# Patient Record
Sex: Female | Born: 1985
Health system: Southern US, Community
[De-identification: ages and names within clinical notes are randomized; demographics above are authoritative.]

## PROBLEM LIST (undated history)

## (undated) DIAGNOSIS — N83209 Unspecified ovarian cyst, unspecified side: Secondary | ICD-10-CM

## (undated) DIAGNOSIS — K219 Gastro-esophageal reflux disease without esophagitis: Secondary | ICD-10-CM

## (undated) DIAGNOSIS — R7303 Prediabetes: Secondary | ICD-10-CM

## (undated) DIAGNOSIS — B379 Candidiasis, unspecified: Secondary | ICD-10-CM

## (undated) DIAGNOSIS — F419 Anxiety disorder, unspecified: Secondary | ICD-10-CM

## (undated) DIAGNOSIS — J301 Allergic rhinitis due to pollen: Secondary | ICD-10-CM

## (undated) DIAGNOSIS — T7840XA Allergy, unspecified, initial encounter: Secondary | ICD-10-CM

## (undated) HISTORY — DX: Gastro-esophageal reflux disease without esophagitis: K21.9

## (undated) HISTORY — DX: Candidiasis, unspecified: B37.9

## (undated) HISTORY — DX: Unspecified ovarian cyst, unspecified side: N83.209

## (undated) HISTORY — DX: Allergy, unspecified, initial encounter: T78.40XA

## (undated) HISTORY — DX: Anxiety disorder, unspecified: F41.9

## (undated) HISTORY — DX: Allergic rhinitis due to pollen: J30.1

## (undated) HISTORY — PX: WISDOM TOOTH EXTRACTION: SHX21

---

## 2008-12-22 ENCOUNTER — Emergency Department: Payer: Self-pay | Admitting: Unknown Physician Specialty

## 2009-04-14 ENCOUNTER — Emergency Department: Payer: Self-pay | Admitting: Emergency Medicine

## 2009-12-27 ENCOUNTER — Emergency Department (HOSPITAL_COMMUNITY): Admission: EM | Admit: 2009-12-27 | Discharge: 2009-12-28 | Payer: Self-pay | Admitting: Emergency Medicine

## 2010-01-21 ENCOUNTER — Emergency Department (HOSPITAL_COMMUNITY): Admission: EM | Admit: 2010-01-21 | Discharge: 2010-01-21 | Payer: Self-pay | Admitting: Family Medicine

## 2012-02-23 ENCOUNTER — Telehealth: Payer: Self-pay | Admitting: Obstetrics and Gynecology

## 2012-02-23 NOTE — Telephone Encounter (Signed)
Triage/pt last seen on 05/28/11/no future appts.

## 2012-02-24 ENCOUNTER — Other Ambulatory Visit: Payer: Self-pay | Admitting: Obstetrics and Gynecology

## 2012-02-24 ENCOUNTER — Telehealth: Payer: Self-pay | Admitting: Obstetrics and Gynecology

## 2012-02-24 MED ORDER — PRENATAL VIT-FEPOLY-FA-DHA 27-1-200 MG PO CAPS: 1.0000 | ORAL_CAPSULE | Freq: Every day | ORAL | Status: DC

## 2012-02-24 NOTE — Telephone Encounter (Signed)
TC to pt. LM to return call regarding message. 

## 2012-02-24 NOTE — Telephone Encounter (Signed)
VM received from pt.   LM to return call.

## 2012-02-24 NOTE — Telephone Encounter (Signed)
TC from pt.   Pharmacy does not have PNV Rx that was prescribed.  To obtain particular brand pharmacy carries to be ordered.  Pt may take OTC if she prefers or may obtain samples from office.

## 2012-02-24 NOTE — Telephone Encounter (Signed)
TC from pt.  States is trying to conceive.  Requests Rx for PNV w/DHA.  Will order per protocol.

## 2012-02-28 ENCOUNTER — Telehealth: Payer: Self-pay | Admitting: Obstetrics and Gynecology

## 2012-02-28 ENCOUNTER — Other Ambulatory Visit: Payer: Self-pay | Admitting: Obstetrics and Gynecology

## 2012-02-28 MED ORDER — CONCEPT DHA 53.5-38-1 MG PO CAPS: 1.0000 | ORAL_CAPSULE | Freq: Every day | ORAL | Status: DC

## 2012-02-28 NOTE — Telephone Encounter (Signed)
TC from pt.  Requesting Concept DHA.  Rx ordered per protocol.

## 2012-08-18 ENCOUNTER — Encounter: Payer: 59 | Admitting: Obstetrics and Gynecology

## 2012-09-01 ENCOUNTER — Ambulatory Visit: Payer: Self-pay | Admitting: Obstetrics and Gynecology

## 2012-09-28 ENCOUNTER — Ambulatory Visit: Payer: 59 | Admitting: Obstetrics and Gynecology

## 2012-10-23 ENCOUNTER — Ambulatory Visit: Payer: 59 | Admitting: Family Medicine

## 2012-10-23 ENCOUNTER — Encounter: Payer: Self-pay | Admitting: Family Medicine

## 2012-10-23 VITALS — BP 112/64 | HR 68 | Resp 16 | Wt 135.0 lb

## 2012-10-23 DIAGNOSIS — Z113 Encounter for screening for infections with a predominantly sexual mode of transmission: Secondary | ICD-10-CM

## 2012-10-23 DIAGNOSIS — Z3202 Encounter for pregnancy test, result negative: Secondary | ICD-10-CM

## 2012-10-23 DIAGNOSIS — Z0000 Encounter for general adult medical examination without abnormal findings: Secondary | ICD-10-CM

## 2012-10-23 DIAGNOSIS — N939 Abnormal uterine and vaginal bleeding, unspecified: Secondary | ICD-10-CM

## 2012-10-23 LAB — POCT WET PREP (WET MOUNT)
Clue Cells Wet Prep Whiff POC: NEGATIVE
pH: 4.5

## 2012-10-23 LAB — POCT URINALYSIS DIPSTICK
Bilirubin, UA: NEGATIVE
Glucose, UA: NEGATIVE
Ketones, UA: NEGATIVE
Leukocytes, UA: NEGATIVE
Nitrite, UA: NEGATIVE
Protein, UA: NEGATIVE
Spec Grav, UA: 1.025
Urobilinogen, UA: NEGATIVE
pH, UA: 5

## 2012-10-23 LAB — POCT URINE PREGNANCY: Preg Test, Ur: NEGATIVE

## 2012-10-23 NOTE — Progress Notes (Signed)
Last Pap: 05/28/2011 WNL: Yes Regular Periods:yes Contraception: None  Monthly Breast exam:yes Tetanus<39yrs:yes Nl.Bladder Function:yes Daily BMs:yes Healthy Diet:yes Calcium:no Mammogram:no Date of Mammogram: N/A Exercise:yes Have often Exercise: 3 x wkl Seatbelt: yes Abuse at home: no Stressful work:no Sigmoid-colonoscopy: None Bone Density: No PCP: Huston Foley Change in PMH: None Change in ZOX:WRUE   Subjective:    Kesleigh Morson is a 27 y.o. female, G1P0, who presents for an annual exam. The patient reports irregular menses x 1, spotting now and yesterday. Normally monthly menses without BTB.  No contraception.  Has been under a lot of stress lately and concerns of unfaithfulness from partner and partner without symptoms.    Menstrual cycle:   LMP: Patient's last menstrual period was 10/02/2012.             Review of Systems Pertinent items are noted in HPI. Denies pelvic pain, urinary tract symptoms, vaginitis symptoms, menopausal symptoms, change in bowel habits or rectal bleeding, no heat or cold intolerances, changes in weight, hair, skin, or breast discharge. Objective:    BP 112/64  Pulse 68  Resp 16  Wt 135 lb (61.236 kg)  LMP 10/02/2012   Wt Readings from Last 1 Encounters:  10/23/12 135 lb (61.236 kg)   There is no height on file to calculate BMI. General Appearance: Alert, no acute distress HEENT: Grossly normal Neck / Thyroid: Supple, no thyromegaly or cervical adenopathy Lungs: Clear to auscultation bilaterally Back: No CVA tenderness Breast Exam: No masses or nodes.No dimpling, nipple retraction or discharge. Cardiovascular: Regular rate and rhythm.  Gastrointestinal: Soft, non-tender, no masses or organomegaly Pelvic Exam: EGBUS-wnl, vagina-normal rugae, parous cervix- without lesions or tenderness, uterus appears normal size shape and consistency, adnexae-no masses or tenderness Lymphatic Exam: Non-palpable nodes in neck, clavicular,  axillary, or  inguinal regions  Skin: no rashes or abnormalities Extremities: no clubbing cyanosis or edema  Neurologic: grossly normal Psychiatric: Alert and oriented  Wet Prep: negative for BV, yeast, trich. Urinalysis: negative UPT: negative   Assessment:   Routine GYN Exam Irregular Menses   Plan:    GC/CT pending. RTO 1 year or prn  Elane Fritz, FNP-BC ]

## 2012-10-24 LAB — GC/CHLAMYDIA PROBE AMP
CT Probe RNA: NEGATIVE
GC Probe RNA: NEGATIVE

## 2012-11-22 ENCOUNTER — Encounter (HOSPITAL_COMMUNITY): Payer: Self-pay | Admitting: Emergency Medicine

## 2012-11-22 ENCOUNTER — Emergency Department (HOSPITAL_COMMUNITY)
Admission: EM | Admit: 2012-11-22 | Discharge: 2012-11-22 | Disposition: A | Discharge status: Home / Self Care | Payer: 59 | Source: Home / Self Care | Attending: Family Medicine | Admitting: Family Medicine

## 2012-11-22 DIAGNOSIS — M7520 Bicipital tendinitis, unspecified shoulder: Secondary | ICD-10-CM

## 2012-11-22 DIAGNOSIS — M7552 Bursitis of left shoulder: Secondary | ICD-10-CM

## 2012-11-22 DIAGNOSIS — M719 Bursopathy, unspecified: Secondary | ICD-10-CM

## 2012-11-22 DIAGNOSIS — M7522 Bicipital tendinitis, left shoulder: Secondary | ICD-10-CM

## 2012-11-22 MED ORDER — DICLOFENAC POTASSIUM 50 MG PO TABS: 50.0000 mg | ORAL_TABLET | Freq: Three times a day (TID) | ORAL | Status: DC

## 2012-11-22 NOTE — ED Notes (Signed)
Reports for the past 3 weeks in left shoulder has felt an irritation, inflammation, and now tight feeling in left upper arm. Patient remembers having a crick in left neck approx 3 weeks ago.  Patient recently started a work out routine 3 weeks ago

## 2012-11-22 NOTE — ED Provider Notes (Signed)
History     CSN: [id number]  Arrival date & time 11/22/12  4098   First MD Initiated Contact with Patient 11/22/12 1902      Chief Complaint  Patient presents with  . Arm Pain    (Consider location/radiation/quality/duration/timing/severity/associated sxs/prior treatment) Patient is a 27 y.o. female presenting with arm pain. The history is provided by the patient.  Arm Pain This is a new problem. The current episode started more than 1 week ago (3 wks ago assoc with exercise workout routine.). The problem has been gradually improving. Associated symptoms include chest pain. Pertinent negatives include no abdominal pain and no shortness of breath.    Past Medical History  Diagnosis Date  . Yeast infection     Past Surgical History  Procedure Laterality Date  . Wisdom tooth extraction      No family history on file.  History  Substance Use Topics  . Smoking status: Never Smoker   . Smokeless tobacco: Never Used  . Alcohol Use: No    OB History   Grav Para Term Preterm Abortions TAB SAB Ect Mult Living   1 0   1 1    0      Review of Systems  Constitutional: Negative.   HENT: Negative for neck pain and neck stiffness.   Respiratory: Negative for chest tightness, shortness of breath and wheezing.   Cardiovascular: Positive for chest pain. Negative for palpitations.  Gastrointestinal: Negative.  Negative for abdominal pain.  Genitourinary: Negative.     Allergies  Review of patient's allergies indicates no known allergies.  Home Medications   Current Outpatient Rx  Name  Route  Sig  Dispense  Refill  . diclofenac (CATAFLAM) 50 MG tablet   Oral   Take 1 tablet (50 mg total) by mouth 3 (three) times daily.   30 tablet   0   . Prenat-FeFum-FePo-FA-Omega 3 (CONCEPT DHA) 53.5-38-1 MG CAPS   Oral   Take 1 capsule by mouth daily.   30 capsule   12   . Prenatal Vit-FePoly-FA-DHA 27-1-200 MG CAPS   Oral   Take 1 tablet by mouth daily.   30 capsule   3       BP 129/84  Pulse 96  Temp(Src) 99.1 F (37.3 C) (Oral)  Resp 16  SpO2 100%  LMP 10/30/2012  Physical Exam  Nursing note and vitals reviewed. Constitutional: She is oriented to person, place, and time. She appears well-developed and well-nourished.  Neck: Normal range of motion. Neck supple.  Cardiovascular: Regular rhythm.   Pulmonary/Chest: Breath sounds normal.  Musculoskeletal: She exhibits tenderness.       Left shoulder: She exhibits tenderness, pain and spasm. She exhibits normal range of motion, no bony tenderness, no swelling, no effusion, normal pulse and normal strength.       Arms: Neurological: She is alert and oriented to person, place, and time.  Skin: Skin is warm and dry.    ED Course  Procedures (including critical care time)  Labs Reviewed - No data to display No results found.   1. Tendonitis, bicipital, left   2. Bursitis of shoulder region, left       MDM          Linna Hoff, MD 11/22/12 2016

## 2013-07-21 ENCOUNTER — Emergency Department (INDEPENDENT_AMBULATORY_CARE_PROVIDER_SITE_OTHER): Payer: 59

## 2013-07-21 ENCOUNTER — Emergency Department (INDEPENDENT_AMBULATORY_CARE_PROVIDER_SITE_OTHER)
Admission: EM | Admit: 2013-07-21 | Discharge: 2013-07-21 | Disposition: A | Discharge status: Home / Self Care | Payer: 59 | Source: Home / Self Care | Attending: Emergency Medicine | Admitting: Emergency Medicine

## 2013-07-21 ENCOUNTER — Encounter (HOSPITAL_COMMUNITY): Payer: Self-pay | Admitting: Emergency Medicine

## 2013-07-21 DIAGNOSIS — R05 Cough: Secondary | ICD-10-CM

## 2013-07-21 DIAGNOSIS — J069 Acute upper respiratory infection, unspecified: Secondary | ICD-10-CM

## 2013-07-21 DIAGNOSIS — R059 Cough, unspecified: Secondary | ICD-10-CM

## 2013-07-21 MED ORDER — FLUTICASONE PROPIONATE 50 MCG/ACT NA SUSP: 2.0000 | Freq: Two times a day (BID) | NASAL | Status: DC

## 2013-07-21 MED ORDER — METHYLPREDNISOLONE 4 MG PO KIT: PACK | ORAL | Status: DC

## 2013-07-21 MED ORDER — AZITHROMYCIN 250 MG PO TABS: ORAL_TABLET | ORAL | Status: DC

## 2013-07-21 MED ORDER — BENZONATATE 200 MG PO CAPS: 200.0000 mg | ORAL_CAPSULE | Freq: Three times a day (TID) | ORAL | Status: DC | PRN

## 2013-07-21 NOTE — ED Provider Notes (Signed)
Medical screening examination/treatment/procedure(s) were performed by non-physician practitioner and as supervising physician I was immediately available for consultation/collaboration.  Leslee Home, M.D.  Reuben Likes, MD 07/21/13 [phone]

## 2013-07-21 NOTE — ED Notes (Signed)
Pt c/o a productive cough w/green phlegm onset 11/28 Has been taking delsym w/no relief Denies: f/v/n/d, SOB, wheezing, cold sxs Alert w/no signs of acute distress.

## 2013-07-21 NOTE — ED Provider Notes (Signed)
CSN: [id number]     Arrival date & time 07/21/13  1520 History   First MD Initiated Contact with Patient 07/21/13 1727     Chief Complaint  Patient presents with  . Cough   (Consider location/radiation/quality/duration/timing/severity/associated sxs/prior Treatment) HPI Comments: 27 year old female presents complaining of cough for just over one week. The cough has been mild, rated 2/10. She is not experiencing any other symptoms. She is not particularly sick and she thinks the cough may be from postnasal drip. She denies fever, chills, pleuritic chest pain, shortness of breath, NVD, recent travel, sick contacts. She has taken very minimal amounts of over-the-counter medications which has helped her symptoms.  Patient is a 27 y.o. female presenting with cough.  Cough Associated symptoms: no chest pain, no chills, no fever, no myalgias, no rash, no rhinorrhea, no shortness of breath, no sore throat and no wheezing     Past Medical History  Diagnosis Date  . Yeast infection    Past Surgical History  Procedure Laterality Date  . Wisdom tooth extraction     No family history on file. History  Substance Use Topics  . Smoking status: Never Smoker   . Smokeless tobacco: Never Used  . Alcohol Use: No   OB History   Grav Para Term Preterm Abortions TAB SAB Ect Mult Living   1 0   1 1    0     Review of Systems  Constitutional: Negative for fever and chills.  HENT: Positive for congestion and postnasal drip. Negative for rhinorrhea, sinus pressure and sore throat.   Eyes: Negative for visual disturbance.  Respiratory: Positive for cough. Negative for chest tightness, shortness of breath and wheezing.   Cardiovascular: Negative for chest pain, palpitations and leg swelling.  Gastrointestinal: Negative for nausea, vomiting and abdominal pain.  Endocrine: Negative for polydipsia and polyuria.  Genitourinary: Negative for dysuria, urgency and frequency.  Musculoskeletal: Negative for  arthralgias and myalgias.  Skin: Negative for rash.  Neurological: Negative for dizziness, weakness and light-headedness.    Allergies  Review of patient's allergies indicates no known allergies.  Home Medications   Current Outpatient Rx  Name  Route  Sig  Dispense  Refill  . azithromycin (ZITHROMAX Z-PAK) 250 MG tablet      Use as directed   6 each   0   . benzonatate (TESSALON) 200 MG capsule   Oral   Take 1 capsule (200 mg total) by mouth 3 (three) times daily as needed for cough.   20 capsule   1   . diclofenac (CATAFLAM) 50 MG tablet   Oral   Take 1 tablet (50 mg total) by mouth 3 (three) times daily.   30 tablet   0   . fluticasone (FLONASE) 50 MCG/ACT nasal spray   Each Nare   Place 2 sprays into both nostrils 2 (two) times daily.   1 g   2   . methylPREDNISolone (MEDROL DOSEPAK) 4 MG tablet      follow package directions   21 tablet   0     Dispense as written.   . Prenat-FeFum-FePo-FA-Omega 3 (CONCEPT DHA) 53.5-38-1 MG CAPS   Oral   Take 1 capsule by mouth daily.   30 capsule   12   . Prenatal Vit-FePoly-FA-DHA 27-1-200 MG CAPS   Oral   Take 1 tablet by mouth daily.   30 capsule   3    BP 148/93  Pulse 97  Temp(Src) 99.4 F (37.4 C) (  Oral)  Resp 18  SpO2 99%  LMP 07/02/2013 Physical Exam  Nursing note and vitals reviewed. Constitutional: She is oriented to person, place, and time. Vital signs are normal. She appears well-developed and well-nourished. No distress.  HENT:  Head: Normocephalic and atraumatic.  Right Ear: External ear normal.  Left Ear: External ear normal.  Mouth/Throat: Oropharynx is clear and moist. No oropharyngeal exudate.  Eyes: Conjunctivae and EOM are normal. Pupils are equal, round, and reactive to light.  Neck: Normal range of motion. Neck supple. No JVD present.  Cardiovascular: Normal rate, regular rhythm and normal heart sounds.  Exam reveals no gallop and no friction rub.   No murmur  heard. Pulmonary/Chest: Effort normal and breath sounds normal. No respiratory distress. She has no wheezes. She has no rales.  Abdominal: Soft. There is no tenderness.  Lymphadenopathy:    She has no cervical adenopathy.  Neurological: She is alert and oriented to person, place, and time. She has normal strength. Coordination normal.  Skin: Skin is warm and dry. No rash noted. She is not diaphoretic.  Psychiatric: She has a normal mood and affect. Judgment normal.    ED Course  Procedures (including critical care time) Labs Review Labs Reviewed - No data to display Imaging Review Dg Chest 2 View  07/21/2013   CLINICAL DATA:  Persistent dry cough.  EXAM: CHEST  2 VIEW  COMPARISON:  None.  FINDINGS: The heart size and mediastinal contours are within normal limits. Both lungs are clear. The visualized skeletal structures are unremarkable.  IMPRESSION: No active cardiopulmonary disease.   Electronically Signed   By: Loralie Champagne M.D.   On: 07/21/2013 17:51      MDM   1. Cough   2. URI (upper respiratory infection)    Physical exam is normal, chest x-ray is normal, this is a viral URI. However, she does have a very low-grade fever. This may be normal, however I will write her a postdated prescription for azithromycin to take if she should start to get worse. She has been instructed to destroy this prescription if she does not use it for this illness within the next 2 weeks. Followup as needed.  Meds ordered this encounter  Medications  . azithromycin (ZITHROMAX Z-PAK) 250 MG tablet    Sig: Use as directed    Dispense:  6 each    Refill:  0    Order Specific Question:  Supervising Provider    Answer:  Lorenz Coaster, DAVID C [id number]  . methylPREDNISolone (MEDROL DOSEPAK) 4 MG tablet    Sig: follow package directions    Dispense:  21 tablet    Refill:  0    Order Specific Question:  Supervising Provider    Answer:  Lorenz Coaster, DAVID C [id number]  . fluticasone (FLONASE) 50 MCG/ACT nasal spray     Sig: Place 2 sprays into both nostrils 2 (two) times daily.    Dispense:  1 g    Refill:  2    Order Specific Question:  Supervising Provider    Answer:  Lorenz Coaster, DAVID C [id number]  . benzonatate (TESSALON) 200 MG capsule    Sig: Take 1 capsule (200 mg total) by mouth 3 (three) times daily as needed for cough.    Dispense:  20 capsule    Refill:  1    Order Specific Question:  Supervising Provider    Answer:  Lorenz Coaster, DAVID C [[id number]]     Graylon Good, PA-C 07/21/13 [phone]

## 2013-11-16 ENCOUNTER — Emergency Department (HOSPITAL_COMMUNITY)
Admission: EM | Admit: 2013-11-16 | Discharge: 2013-11-16 | Disposition: A | Discharge status: Home / Self Care | Payer: 59 | Source: Home / Self Care

## 2013-11-27 ENCOUNTER — Ambulatory Visit: Payer: Self-pay | Admitting: Family Medicine

## 2013-12-03 ENCOUNTER — Ambulatory Visit: Payer: Self-pay | Admitting: General Surgery

## 2013-12-26 ENCOUNTER — Ambulatory Visit: Payer: Self-pay | Admitting: General Surgery

## 2013-12-27 ENCOUNTER — Encounter: Payer: Self-pay | Admitting: *Deleted

## 2014-06-17 ENCOUNTER — Encounter (HOSPITAL_COMMUNITY): Payer: Self-pay | Admitting: Emergency Medicine

## 2014-09-19 ENCOUNTER — Encounter (HOSPITAL_COMMUNITY): Payer: Self-pay | Admitting: Emergency Medicine

## 2014-09-19 ENCOUNTER — Emergency Department (INDEPENDENT_AMBULATORY_CARE_PROVIDER_SITE_OTHER)
Admission: EM | Admit: 2014-09-19 | Discharge: 2014-09-19 | Disposition: A | Discharge status: Home / Self Care | Payer: 59 | Source: Home / Self Care | Attending: Family Medicine | Admitting: Family Medicine

## 2014-09-19 DIAGNOSIS — L03213 Periorbital cellulitis: Secondary | ICD-10-CM

## 2014-09-19 DIAGNOSIS — H00033 Abscess of eyelid right eye, unspecified eyelid: Secondary | ICD-10-CM

## 2014-09-19 MED ORDER — DOXYCYCLINE HYCLATE 100 MG PO CAPS: 100.0000 mg | ORAL_CAPSULE | Freq: Two times a day (BID) | ORAL | Status: DC

## 2014-09-19 NOTE — ED Notes (Signed)
C/o  Bump under right eye since Sunday.  States "woke this a.m with a puffy eye".   No otc treats tried.

## 2014-09-19 NOTE — Discharge Instructions (Signed)
Thank you for coming in today. Take doxycycline twice daily for one week. Use warm compress. Use birth control methods will taking this antibiotics.  Cellulitis Cellulitis is an infection of the skin and the tissue beneath it. The infected area is usually red and tender. Cellulitis occurs most often in the arms and lower legs.  CAUSES  Cellulitis is caused by bacteria that enter the skin through cracks or cuts in the skin. The most common types of bacteria that cause cellulitis are staphylococci and streptococci. SIGNS AND SYMPTOMS   Redness and warmth.  Swelling.  Tenderness or pain.  Fever. DIAGNOSIS  Your health care provider can usually determine what is wrong based on a physical exam. Blood tests may also be done. TREATMENT  Treatment usually involves taking an antibiotic medicine. HOME CARE INSTRUCTIONS   Take your antibiotic medicine as directed by your health care provider. Finish the antibiotic even if you start to feel better.  Keep the infected arm or leg elevated to reduce swelling.  Apply a warm cloth to the affected area up to 4 times per day to relieve pain.  Take medicines only as directed by your health care provider.  Keep all follow-up visits as directed by your health care provider. SEEK MEDICAL CARE IF:   You notice red streaks coming from the infected area.  Your red area gets larger or turns dark in color.  Your bone or joint underneath the infected area becomes painful after the skin has healed.  Your infection returns in the same area or another area.  You notice a swollen bump in the infected area.  You develop new symptoms.  You have a fever. SEEK IMMEDIATE MEDICAL CARE IF:   You feel very sleepy.  You develop vomiting or diarrhea.  You have a general ill feeling (malaise) with muscle aches and pains. MAKE SURE YOU:   Understand these instructions.  Will watch your condition.  Will get help right away if you are not doing well or  get worse. Document Released: 05/12/2005 Document Revised: 12/17/2013 Document Reviewed: 10/18/2011 [facility] Patient Information 2015 [place], [place]. This information is not intended to replace advice given to you by your health care provider. Make sure you discuss any questions you have with your health care provider.

## 2014-09-19 NOTE — ED Notes (Signed)
Bed: UC09 Expected date:  Expected time:  Means of arrival:  Comments: 

## 2014-09-19 NOTE — ED Provider Notes (Signed)
Jessica Villarreal is a 29 y.o. female who presents to Urgent Care today for right lower eyelid swelling present for 4 days worsening today. No significant pain no blurry vision or eye discharge. Patient has tried some topical clindamycin cream which has not helped. She has a history of acne. No fevers.   Past Medical History  Diagnosis Date  . Yeast infection    Past Surgical History  Procedure Laterality Date  . Wisdom tooth extraction     History  Substance Use Topics  . Smoking status: Never Smoker   . Smokeless tobacco: Never Used  . Alcohol Use: No   ROS as above Medications: No current facility-administered medications for this encounter.   Current Outpatient Prescriptions  Medication Sig Dispense Refill  . azithromycin (ZITHROMAX Z-PAK) 250 MG tablet Use as directed 6 each 0  . benzonatate (TESSALON) 200 MG capsule Take 1 capsule (200 mg total) by mouth 3 (three) times daily as needed for cough. 20 capsule 1  . diclofenac (CATAFLAM) 50 MG tablet Take 1 tablet (50 mg total) by mouth 3 (three) times daily. 30 tablet 0  . doxycycline (VIBRAMYCIN) 100 MG capsule Take 1 capsule (100 mg total) by mouth 2 (two) times daily. 14 capsule 0  . fluticasone (FLONASE) 50 MCG/ACT nasal spray Place 2 sprays into both nostrils 2 (two) times daily. 1 g 2  . methylPREDNISolone (MEDROL DOSEPAK) 4 MG tablet follow package directions 21 tablet 0  . Prenat-FeFum-FePo-FA-Omega 3 (CONCEPT DHA) 53.5-38-1 MG CAPS Take 1 capsule by mouth daily. 30 capsule 12  . Prenatal Vit-FePoly-FA-DHA 27-1-200 MG CAPS Take 1 tablet by mouth daily. 30 capsule 3   No Known Allergies   Exam:  BP 128/81 mmHg  Pulse 87  Temp(Src) 98 F (36.7 C) (Oral)  Resp 16  SpO2 100%  LMP 09/14/2014 Gen: Well NAD HEENT: EOMI,  MMM mild right lower eyelid swelling. No conjunctival injection or discharge. Normal eye motion without pain. Lungs: Normal work of breathing. CTABL Heart: RRR no MRG Exts: Brisk capillary refill,  warm and well perfused.  Skin: Small tender erythematous papule just inferior to the right lower eyelid.  No results found for this or any previous visit (from the past 24 hour(s)). No results found.  Assessment and Plan: 29 y.o. female with probable cystic acne with emerging preseptal cellulitis. Treat with doxycycline watchful waiting and warm compresses. Return as needed.  Discussed warning signs or symptoms. Please see discharge instructions. Patient expresses understanding.     Gregor Hams, MD 09/19/14 [phone]

## 2015-02-12 ENCOUNTER — Encounter: Payer: Self-pay | Admitting: Family Medicine

## 2015-10-10 ENCOUNTER — Encounter: Payer: Self-pay | Admitting: Physician Assistant

## 2015-10-10 ENCOUNTER — Ambulatory Visit (INDEPENDENT_AMBULATORY_CARE_PROVIDER_SITE_OTHER): Payer: 59 | Admitting: Physician Assistant

## 2015-10-10 VITALS — BP 102/70 | HR 82 | Temp 98.8°F | Resp 16 | Wt 137.0 lb

## 2015-10-10 DIAGNOSIS — R059 Cough, unspecified: Secondary | ICD-10-CM

## 2015-10-10 DIAGNOSIS — J302 Other seasonal allergic rhinitis: Secondary | ICD-10-CM | POA: Diagnosis not present

## 2015-10-10 DIAGNOSIS — R05 Cough: Secondary | ICD-10-CM | POA: Diagnosis not present

## 2015-10-10 DIAGNOSIS — J309 Allergic rhinitis, unspecified: Secondary | ICD-10-CM | POA: Insufficient documentation

## 2015-10-10 DIAGNOSIS — Z111 Encounter for screening for respiratory tuberculosis: Secondary | ICD-10-CM | POA: Insufficient documentation

## 2015-10-10 MED ORDER — FLUTICASONE PROPIONATE 50 MCG/ACT NA SUSP: 2.0000 | Freq: Every day | NASAL | Status: DC

## 2015-10-10 MED ORDER — BENZONATATE 200 MG PO CAPS: 200.0000 mg | ORAL_CAPSULE | Freq: Three times a day (TID) | ORAL | Status: DC | PRN

## 2015-10-10 MED ORDER — HYDROCODONE-HOMATROPINE 5-1.5 MG/5ML PO SYRP: 5.0000 mL | ORAL_SOLUTION | Freq: Three times a day (TID) | ORAL | Status: DC | PRN

## 2015-10-10 NOTE — Patient Instructions (Signed)
Allergies An allergy is an abnormal reaction to a substance by the body's defense system (immune system). Allergies can develop at any age. WHAT CAUSES ALLERGIES? An allergic reaction happens when the immune system mistakenly reacts to a normally harmless substance, called an allergen, as if it were harmful. The immune system releases antibodies to fight the substance. Antibodies eventually release a chemical called histamine into the bloodstream. The release of histamine is meant to protect the body from infection, but it also causes discomfort. An allergic reaction can be triggered by:  Eating an allergen.  Inhaling an allergen.  Touching an allergen. WHAT TYPES OF ALLERGIES ARE THERE? There are many types of allergies. Common types include:  Seasonal allergies. People with this type of allergy are usually allergic to substances that are only present during certain seasons, such as molds and pollens.  Food allergies.  Drug allergies.  Insect allergies.  Animal dander allergies. WHAT ARE SYMPTOMS OF ALLERGIES? Possible allergy symptoms include:  Swelling of the lips, face, tongue, mouth, or throat.  Sneezing, coughing, or wheezing.  Nasal congestion.  Tingling in the mouth.  Rash.  Itching.  Itchy, red, swollen areas of skin (hives).  Watery eyes.  Vomiting.  Diarrhea.  Dizziness.  Lightheadedness.  Fainting.  Trouble breathing or swallowing.  Chest tightness.  Rapid heartbeat. HOW ARE ALLERGIES DIAGNOSED? Allergies are diagnosed with a medical and family history and one or more of the following:  Skin tests.  Blood tests.  A food diary. A food diary is a record of all the foods and drinks you have in a day and of all the symptoms you experience.  The results of an elimination diet. An elimination diet involves eliminating foods from your diet and then adding them back in one by one to find out if a certain food causes an allergic reaction. HOW ARE  ALLERGIES TREATED? There is no cure for allergies, but allergic reactions can be treated with medicine. Severe reactions usually need to be treated at a hospital. HOW CAN REACTIONS BE PREVENTED? The best way to prevent an allergic reaction is by avoiding the substance you are allergic to. Allergy shots and medicines can also help prevent reactions in some cases. People with severe allergic reactions may be able to prevent a life-threatening reaction called anaphylaxis with a medicine given right after exposure to the allergen.   This information is not intended to replace advice given to you by your health care provider. Make sure you discuss any questions you have with your health care provider.   Document Released: 10/26/2002 Document Revised: 08/23/2014 Document Reviewed: 05/14/2014 Elsevier Interactive Patient Education 2016 [employer] Cough, Adult Coughing is a reflex that clears your throat and your airways. Coughing helps to heal and protect your lungs. It is normal to cough occasionally, but a cough that happens with other symptoms or lasts a long time may be a sign of a condition that needs treatment. A cough may last only 2-3 weeks (acute), or it may last longer than 8 weeks (chronic). CAUSES Coughing is commonly caused by:  Breathing in substances that irritate your lungs.  A viral or bacterial respiratory infection.  Allergies.  Asthma.  Postnasal drip.  Smoking.  Acid backing up from the stomach into the esophagus (gastroesophageal reflux).  Certain medicines.  Chronic lung problems, including COPD (or rarely, lung cancer).  Other medical conditions such as heart failure. HOME CARE INSTRUCTIONS  Pay attention to any changes in your symptoms. Take these actions to help  with your discomfort:  Take medicines only as told by your health care provider.  If you were prescribed an antibiotic medicine, take it as told by your health care provider. Do not stop taking the  antibiotic even if you start to feel better.  Talk with your health care provider before you take a cough suppressant medicine.  Drink enough fluid to keep your urine clear or pale yellow.  If the air is dry, use a cold steam vaporizer or humidifier in your bedroom or your home to help loosen secretions.  Avoid anything that causes you to cough at work or at home.  If your cough is worse at night, try sleeping in a semi-upright position.  Avoid cigarette smoke. If you smoke, quit smoking. If you need help quitting, ask your health care provider.  Avoid caffeine.  Avoid alcohol.  Rest as needed. SEEK MEDICAL CARE IF:   You have new symptoms.  You cough up pus.  Your cough does not get better after 2-3 weeks, or your cough gets worse.  You cannot control your cough with suppressant medicines and you are losing sleep.  You develop pain that is getting worse or pain that is not controlled with pain medicines.  You have a fever.  You have unexplained weight loss.  You have night sweats. SEEK IMMEDIATE MEDICAL CARE IF:  You cough up blood.  You have difficulty breathing.  Your heartbeat is very fast.   This information is not intended to replace advice given to you by your health care provider. Make sure you discuss any questions you have with your health care provider.   Document Released: 01/29/2011 Document Revised: 04/23/2015 Document Reviewed: 10/09/2014 Elsevier Interactive Patient Education [employer].

## 2015-10-10 NOTE — Progress Notes (Signed)
Patient: Jessica Villarreal Female    DOB: 1986/02/27   30 y.o.   MRN: [id number] Visit Date: 10/10/2015  Today's Provider: Mar Daring, PA-C   Chief Complaint  Patient presents with  . Cough   Subjective:    Cough This is a new problem. The current episode started in the past 7 days (cough got worst on Tuesday). The problem has been gradually worsening. The problem occurs constantly. The cough is productive of sputum (Clear). Associated symptoms include postnasal drip and a sore throat (scratchy on the left side.). Pertinent negatives include no chest pain, chills, ear pain, fever, headaches, nasal congestion, rhinorrhea, shortness of breath or wheezing. The symptoms are aggravated by lying down (talking). Treatments tried: Delsym, Zyrtec. The treatment provided mild relief.      No Known Allergies Previous Medications   AZITHROMYCIN (ZITHROMAX Z-PAK) 250 MG TABLET    Use as directed   BENZONATATE (TESSALON) 200 MG CAPSULE    Take 1 capsule (200 mg total) by mouth 3 (three) times daily as needed for cough.   DOXYCYCLINE (VIBRAMYCIN) 100 MG CAPSULE    Take 1 capsule (100 mg total) by mouth 2 (two) times daily.   FLUTICASONE (FLONASE) 50 MCG/ACT NASAL SPRAY    Place 2 sprays into both nostrils 2 (two) times daily.   METHYLPREDNISOLONE (MEDROL DOSEPAK) 4 MG TABLET    follow package directions   TRI-PREVIFEM 0.18/0.215/0.25 MG-35 MCG TABLET    Take 1 tablet by mouth daily. as directed    Review of Systems  Constitutional: Negative for fever and chills.  HENT: Positive for congestion, postnasal drip, sinus pressure and sore throat (scratchy on the left side.). Negative for ear pain, rhinorrhea and sneezing.   Respiratory: Positive for cough. Negative for chest tightness, shortness of breath and wheezing.   Cardiovascular: Positive for palpitations. Negative for chest pain and leg swelling.  Gastrointestinal: Negative for nausea, vomiting and abdominal pain.  Neurological:  Negative for dizziness, weakness, light-headedness and headaches.    Social History  Substance Use Topics  . Smoking status: Never Smoker   . Smokeless tobacco: Never Used  . Alcohol Use: No   Objective:   BP 102/70 mmHg  Pulse 82  Temp(Src) 98.8 F (37.1 C) (Oral)  Resp 16  Wt 137 lb (62.143 kg)  SpO2 97%  LMP 09/17/2015  Physical Exam  Constitutional: She appears well-developed and well-nourished. No distress.  HENT:  Head: Normocephalic and atraumatic.  Right Ear: Hearing, tympanic membrane, external ear and ear canal normal.  Left Ear: Hearing, tympanic membrane, external ear and ear canal normal.  Nose: Mucosal edema (enlarged, pale turbinates) and rhinorrhea present. Right sinus exhibits no maxillary sinus tenderness and no frontal sinus tenderness. Left sinus exhibits no maxillary sinus tenderness and no frontal sinus tenderness.  Mouth/Throat: Uvula is midline, oropharynx is clear and moist and mucous membranes are normal. No oropharyngeal exudate, posterior oropharyngeal edema or posterior oropharyngeal erythema.  Eyes: Conjunctivae are normal. Pupils are equal, round, and reactive to light. Right eye exhibits no discharge. Left eye exhibits no discharge. No scleral icterus.  Neck: Normal range of motion. Neck supple. No tracheal deviation present. No thyromegaly present.  Cardiovascular: Normal rate, regular rhythm and normal heart sounds.  Exam reveals no gallop and no friction rub.   No murmur heard. Pulmonary/Chest: Effort normal and breath sounds normal. No stridor. No respiratory distress. She has no wheezes. She has no rales.  Lymphadenopathy:    She has  no cervical adenopathy.  Skin: Skin is warm and dry. She is not diaphoretic.  Vitals reviewed.       Assessment & Plan:     1. Cough Worsening cough most likely secondary to post nasal drainage from allergies.  Will give tessalon perles as below for daytime cough. Hycodan cough syrup for nighttime cough.  Advised to stay well hydrated and get plenty of rest. She is to call the office if cough worsens. - benzonatate (TESSALON) 200 MG capsule; Take 1 capsule (200 mg total) by mouth 3 (three) times daily as needed for cough.  Dispense: 30 capsule; Refill: 1 - HYDROcodone-homatropine (HYCODAN) 5-1.5 MG/5ML syrup; Take 5 mLs by mouth every 8 (eight) hours as needed for cough.  Dispense: 180 mL; Refill: 0  2. Other seasonal allergic rhinitis I do feel symptoms are secondary to uncontrolled allergies.  She has had flonase before for allergic rhinitis and used successfully.  Will refill as below. Advised to also continue zyrtec daily through allergy season to help prevent infections. - fluticasone (FLONASE) 50 MCG/ACT nasal spray; Place 2 sprays into both nostrils daily.  Dispense: 16 g; Refill: 2       Mar Daring, PA-C  [facility]

## 2015-10-13 ENCOUNTER — Encounter: Payer: Self-pay | Admitting: Physician Assistant

## 2015-10-13 ENCOUNTER — Ambulatory Visit (INDEPENDENT_AMBULATORY_CARE_PROVIDER_SITE_OTHER): Payer: 59 | Admitting: Physician Assistant

## 2015-10-13 VITALS — BP 104/70 | HR 98 | Temp 98.5°F | Resp 16 | Wt 138.6 lb

## 2015-10-13 DIAGNOSIS — H6983 Other specified disorders of Eustachian tube, bilateral: Secondary | ICD-10-CM | POA: Diagnosis not present

## 2015-10-13 NOTE — Patient Instructions (Signed)

## 2015-10-13 NOTE — Progress Notes (Signed)
Patient: Jessica Villarreal Female    DOB: 12-28-1985   30 y.o.   MRN: [id number] Visit Date: 10/13/2015  Today's Provider: Mar Daring, PA-C   Chief Complaint  Patient presents with  . Cerumen Impaction   Subjective:    HPI  Jessica Villarreal is a 30 yr old female that is here today with complaints of a fullness behind both of her ears.  She denies any pain, dizziness, tinnitus, hearing loss or headaches.  Wants to make sure not cerumen impaction.     No Known Allergies Previous Medications   BENZONATATE (TESSALON) 200 MG CAPSULE    Take 1 capsule (200 mg total) by mouth 3 (three) times daily as needed for cough.   FLUTICASONE (FLONASE) 50 MCG/ACT NASAL SPRAY    Place 2 sprays into both nostrils daily.   HYDROCODONE-HOMATROPINE (HYCODAN) 5-1.5 MG/5ML SYRUP    Take 5 mLs by mouth every 8 (eight) hours as needed for cough.   TRI-PREVIFEM 0.18/0.215/0.25 MG-35 MCG TABLET    Take 1 tablet by mouth daily. as directed    Review of Systems  Constitutional: Negative.   HENT: Positive for postnasal drip and sinus pressure. Negative for ear discharge, ear pain, sore throat and tinnitus.   Respiratory: Positive for cough. Negative for chest tightness, shortness of breath and wheezing.   Cardiovascular: Negative for chest pain.  Gastrointestinal: Negative for nausea, vomiting and abdominal pain.  Neurological: Negative for dizziness and headaches.    Social History  Substance Use Topics  . Smoking status: Never Smoker   . Smokeless tobacco: Never Used  . Alcohol Use: No   Objective:   BP 104/70 mmHg  Pulse 98  Temp(Src) 98.5 F (36.9 C) (Oral)  Resp 16  Wt 138 lb 9.6 oz (62.869 kg)  LMP 09/17/2015  Physical Exam  Constitutional: She appears well-developed and well-nourished. No distress.  HENT:  Head: Normocephalic and atraumatic.  Right Ear: Hearing, external ear and ear canal normal. Tympanic membrane is bulging (slight). Tympanic membrane is not perforated and  not erythematous. A middle ear effusion (cleAR) is present.  Left Ear: Hearing, external ear and ear canal normal. Tympanic membrane is bulging (slight). Tympanic membrane is not perforated and not erythematous. A middle ear effusion (clear) is present.  Nose: Nose normal. Right sinus exhibits no maxillary sinus tenderness and no frontal sinus tenderness. Left sinus exhibits no maxillary sinus tenderness and no frontal sinus tenderness.  Mouth/Throat: Uvula is midline, oropharynx is clear and moist and mucous membranes are normal. No oropharyngeal exudate, posterior oropharyngeal edema or posterior oropharyngeal erythema.  Eyes: Conjunctivae are normal. Pupils are equal, round, and reactive to light. Right eye exhibits no discharge. Left eye exhibits no discharge. No scleral icterus.  Neck: Normal range of motion. Neck supple. No tracheal deviation present. No thyromegaly present.  Cardiovascular: Normal rate, regular rhythm and normal heart sounds.  Exam reveals no gallop and no friction rub.   No murmur heard. Pulmonary/Chest: Effort normal and breath sounds normal. No stridor. No respiratory distress. She has no wheezes. She has no rales.  Lymphadenopathy:    She has no cervical adenopathy.  Skin: Skin is warm and dry. She is not diaphoretic.  Vitals reviewed.       Assessment & Plan:     1. ETD (eustachian tube dysfunction), bilateral Advised to add sudafed to current allergy regimen of flonase and claritin which she just started on Friday 10/10/15.  She is to do this  for 2 weeks and stop sudafed. She is to call the office at anytime if she develops pain, fevers, discharge from the ear, tinnitus or dizziness.       Mar Daring, PA-C  [facility]

## 2015-11-26 ENCOUNTER — Ambulatory Visit: Payer: 59 | Admitting: Gastroenterology

## 2016-06-03 ENCOUNTER — Encounter (HOSPITAL_COMMUNITY): Payer: Self-pay | Admitting: Emergency Medicine

## 2016-06-03 ENCOUNTER — Ambulatory Visit (HOSPITAL_COMMUNITY)
Admission: EM | Admit: 2016-06-03 | Discharge: 2016-06-03 | Disposition: A | Discharge status: Home / Self Care | Payer: 59 | Attending: Family Medicine | Admitting: Family Medicine

## 2016-06-03 DIAGNOSIS — R002 Palpitations: Secondary | ICD-10-CM | POA: Diagnosis not present

## 2016-06-03 MED ORDER — CLONAZEPAM 0.5 MG PO TABS: 0.5000 mg | ORAL_TABLET | Freq: Two times a day (BID) | ORAL | 0 refills | Status: DC | PRN

## 2016-06-03 NOTE — ED Provider Notes (Signed)
[facility]    CSN: [id number] Arrival date & time: 06/03/16  1059     History   Chief Complaint No chief complaint on file.   HPI Jessica Villarreal is a 30 y.o. female.   The history is provided by the patient.  Palpitations  Palpitations quality:  Fast Onset quality:  Sudden Duration:  2 days Progression:  Unchanged Chronicity:  Recurrent Context: appetite suppressants   Relieved by:  None tried Worsened by:  Stress Ineffective treatments:  None tried Associated symptoms: no back pain, no chest pain, no chest pressure, no dizziness, no leg pain and no shortness of breath     Past Medical History:  Diagnosis Date  . Yeast infection     Patient Active Problem List   Diagnosis Date Noted  . Allergic rhinitis 10/10/2015  . Encounter for screening for respiratory tuberculosis 10/10/2015    Past Surgical History:  Procedure Laterality Date  . WISDOM TOOTH EXTRACTION      OB History    Gravida Para Term Preterm AB Living   1 0     1 0   SAB TAB Ectopic Multiple Live Births     1             Home Medications    Prior to Admission medications   Medication Sig Start Date End Date Taking? Authorizing Provider  benzonatate (TESSALON) 200 MG capsule Take 1 capsule (200 mg total) by mouth 3 (three) times daily as needed for cough. 10/10/15   Mar Daring, PA-C  fluticasone (FLONASE) 50 MCG/ACT nasal spray Place 2 sprays into both nostrils daily. 10/10/15   Mar Daring, PA-C  HYDROcodone-homatropine (HYCODAN) 5-1.5 MG/5ML syrup Take 5 mLs by mouth every 8 (eight) hours as needed for cough. 10/10/15   Mar Daring, PA-C  TRI-PREVIFEM 0.18/0.215/0.25 MG-35 MCG tablet Take 1 tablet by mouth daily. as directed 08/28/15   Historical Provider, MD    Family History No family history on file.  Social History Social History  Substance Use Topics  . Smoking status: Never Smoker  . Smokeless tobacco: Never Used  . Alcohol use No      Allergies   Review of patient's allergies indicates no known allergies.   Review of Systems Review of Systems  HENT: Negative.   Respiratory: Negative.  Negative for shortness of breath.   Cardiovascular: Positive for palpitations. Negative for chest pain.  Gastrointestinal: Negative.   Musculoskeletal: Negative for back pain.  Neurological: Negative for dizziness.  Psychiatric/Behavioral: Positive for sleep disturbance.  All other systems reviewed and are negative.    Physical Exam Triage Vital Signs ED Triage Vitals [06/03/16 1223]  Enc Vitals Group     BP 119/76     Pulse Rate 86     Resp 12     Temp 99.7 F (37.6 C)     Temp Source Oral     SpO2 100 %     Weight      Height      Head Circumference      Peak Flow      Pain Score      Pain Loc      Pain Edu?      Excl. in [place]?    No data found.   Updated Vital Signs BP 119/76 (BP Location: Left Arm)   Pulse 86   Temp 99.7 F (37.6 C) (Oral)   Resp 12   LMP 05/14/2016   SpO2 100%  Visual Acuity Right Eye Distance:   Left Eye Distance:   Bilateral Distance:    Right Eye Near:   Left Eye Near:    Bilateral Near:     Physical Exam  Constitutional: She appears well-developed and well-nourished. No distress.  Neck: Normal range of motion. Neck supple. No thyromegaly present.  Cardiovascular: Normal rate, regular rhythm, normal heart sounds and intact distal pulses.   Pulmonary/Chest: Effort normal and breath sounds normal.  Lymphadenopathy:    She has no cervical adenopathy.  Skin: Skin is warm and dry.  Nursing note and vitals reviewed.    UC Treatments / Results  Labs (all labs ordered are listed, but only abnormal results are displayed) Labs Reviewed - No data to display  EKG ED ECG REPORT   Date: 06/03/2016  Rate: 90  Rhythm: normal sinus rhythm  QRS Axis: normal  Intervals: normal  ST/T Wave abnormalities: normal  Conduction Disutrbances:none  Narrative Interpretation:    Old EKG Reviewed: unchanged  I have personally reviewed the EKG tracing and agree with the computerized printout as noted.   Radiology No results found.  Procedures Procedures (including critical care time)  Medications Ordered in UC Medications - No data to display   Initial Impression / Assessment and Plan / UC Course  I have reviewed the triage vital signs and the nursing notes.  Pertinent labs & imaging results that were available during my care of the patient were reviewed by me and considered in my medical decision making (see chart for details).  Clinical Course    Admits to work stress and will pursue work eap program.  Final Clinical Impressions(s) / UC Diagnoses   Final diagnoses:  None    New Prescriptions New Prescriptions   No medications on file     Billy Fischer, MD 06/03/16 1308

## 2016-06-03 NOTE — ED Triage Notes (Signed)
Reports similar episode in 2010-told she was dehydrated by the ed.    Reports palpitations since 10/17.  Feeling "excited, but not excited about anything" reports only getting 3-4 hours sleep.  Patient is tearful

## 2016-06-03 NOTE — ED Notes (Signed)
Patient is very tearful and concerned for work environment.

## 2016-06-03 NOTE — Discharge Instructions (Signed)
See your doctor if further issues.

## 2016-08-11 ENCOUNTER — Encounter (HOSPITAL_COMMUNITY): Payer: Self-pay | Admitting: Emergency Medicine

## 2016-08-11 ENCOUNTER — Ambulatory Visit (HOSPITAL_COMMUNITY)
Admission: EM | Admit: 2016-08-11 | Discharge: 2016-08-11 | Disposition: A | Discharge status: Home / Self Care | Payer: 59 | Attending: Family Medicine | Admitting: Family Medicine

## 2016-08-11 DIAGNOSIS — N760 Acute vaginitis: Secondary | ICD-10-CM | POA: Insufficient documentation

## 2016-08-11 DIAGNOSIS — Z79899 Other long term (current) drug therapy: Secondary | ICD-10-CM | POA: Insufficient documentation

## 2016-08-11 DIAGNOSIS — B9689 Other specified bacterial agents as the cause of diseases classified elsewhere: Secondary | ICD-10-CM

## 2016-08-11 MED ORDER — METRONIDAZOLE 500 MG PO TABS: 500.0000 mg | ORAL_TABLET | Freq: Two times a day (BID) | ORAL | 0 refills | Status: DC

## 2016-08-11 NOTE — ED Provider Notes (Signed)
CSN: [id number]     Arrival date & time 08/11/16  1916 History   First MD Initiated Contact with Patient 08/11/16 1952     Chief Complaint  Patient presents with  . Vaginal Discharge   (Consider location/radiation/quality/duration/timing/severity/associated sxs/prior Treatment) 30 year old female presents to clinic with chief complaint of vaginal discharge. Patient describes discharge as being smooth in texture and yellow, patient denies vaginal pain, burning, itching, abdominal pain, or pain with intercourse. Patient reports she is monogamous with one partner and uses condoms with each encounter.     The history is provided by the patient.  Vaginal Discharge  Associated symptoms: no dyspareunia and no dysuria     Past Medical History:  Diagnosis Date  . Yeast infection    Past Surgical History:  Procedure Laterality Date  . WISDOM TOOTH EXTRACTION     History reviewed. No pertinent family history. Social History  Substance Use Topics  . Smoking status: Never Smoker  . Smokeless tobacco: Never Used  . Alcohol use No   OB History    Gravida Para Term Preterm AB Living   1 0     1 0   SAB TAB Ectopic Multiple Live Births     1           Review of Systems  Constitutional: Negative.   HENT: Negative.   Respiratory: Negative.   Cardiovascular: Negative.   Gastrointestinal: Negative.   Genitourinary: Positive for vaginal discharge. Negative for difficulty urinating, dyspareunia, dysuria, flank pain, frequency, genital sores, hematuria, menstrual problem, pelvic pain, urgency, vaginal bleeding and vaginal pain.  Musculoskeletal: Negative.   Neurological: Negative.   Hematological: Negative.     Allergies  Patient has no known allergies.  Home Medications   Prior to Admission medications   Medication Sig Start Date End Date Taking? Authorizing Provider  metroNIDAZOLE (FLAGYL) 500 MG tablet Take 1 tablet (500 mg total) by mouth 2 (two) times daily. 08/11/16   Barnet Glasgow, NP   Meds Ordered and Administered this Visit  Medications - No data to display  BP 137/87 (BP Location: Left Arm)   Pulse 97   Temp 98.6 F (37 C) (Oral)   Resp 18   LMP 08/04/2016 (Exact Date)   SpO2 100%  No data found.   Physical Exam  Constitutional: She is oriented to person, place, and time. She appears well-developed and well-nourished. No distress.  HENT:  Head: Normocephalic.  Right Ear: External ear normal.  Left Ear: External ear normal.  Neck: Normal range of motion. Neck supple. No JVD present.  Cardiovascular: Normal rate, regular rhythm and normal heart sounds.   Pulmonary/Chest: Effort normal.  Abdominal: Soft.  Genitourinary:  Genitourinary Comments: GU exam deferred   Lymphadenopathy:    She has no cervical adenopathy.  Neurological: She is alert and oriented to person, place, and time.  Skin: Skin is warm and dry. Capillary refill takes less than 2 seconds. She is not diaphoretic.  Psychiatric: She has a normal mood and affect.  Nursing note and vitals reviewed.   Urgent Care Course   Clinical Course     Procedures (including critical care time)  Labs Review Labs Reviewed  URINE CYTOLOGY ANCILLARY ONLY    Imaging Review No results found.   Visual Acuity Review  Right Eye Distance:   Left Eye Distance:   Bilateral Distance:    Right Eye Near:   Left Eye Near:    Bilateral Near:  MDM   1. BV (bacterial vaginosis)    Urine cytology collected to test for Gc/claymidia, and trichomonas, and she will be notified of her results. Patient prescribed metronidazole in clinic and advised to avoid alcohol.  Should symptoms persist or fail to resolve, follow up with PCP or return to clinic.     Barnet Glasgow, NP 08/11/16 2043

## 2016-08-11 NOTE — ED Triage Notes (Signed)
The patient presented to the [facility] with a complaint of a vaginal discharge that started today. The patient denied any dysuria, abdominal pain or low back pain.

## 2016-08-11 NOTE — Discharge Instructions (Signed)
You are being treated tonight for bacterial vaginosis. You are being tested for gonorrhea and chlamydia as well.  You will be notified of the results of those tests and if positive you will be treated. While taking the metronidazole, do not drink any alcohol. Should symptoms fail to resolve or worsen, follow up with your primary care provider or return to clinic.

## 2016-08-12 LAB — URINE CYTOLOGY ANCILLARY ONLY
CHLAMYDIA, DNA PROBE: NEGATIVE
NEISSERIA GONORRHEA: NEGATIVE

## 2016-08-13 LAB — URINE CYTOLOGY ANCILLARY ONLY: Bacterial vaginitis: NEGATIVE

## 2016-11-04 ENCOUNTER — Ambulatory Visit: Payer: 59 | Admitting: Physician Assistant

## 2016-11-09 ENCOUNTER — Encounter: Payer: Self-pay | Admitting: Physician Assistant

## 2016-11-09 ENCOUNTER — Ambulatory Visit (INDEPENDENT_AMBULATORY_CARE_PROVIDER_SITE_OTHER): Payer: 59 | Admitting: Physician Assistant

## 2016-11-09 VITALS — BP 106/70 | HR 104 | Ht 61.75 in | Wt 134.5 lb

## 2016-11-09 DIAGNOSIS — K5900 Constipation, unspecified: Secondary | ICD-10-CM | POA: Diagnosis not present

## 2016-11-09 DIAGNOSIS — K6289 Other specified diseases of anus and rectum: Secondary | ICD-10-CM | POA: Diagnosis not present

## 2016-11-09 DIAGNOSIS — K645 Perianal venous thrombosis: Secondary | ICD-10-CM | POA: Diagnosis not present

## 2016-11-09 MED ORDER — HYDROCORTISONE 2.5 % RE CREA: 1.0000 "application " | TOPICAL_CREAM | Freq: Two times a day (BID) | RECTAL | 1 refills | Status: DC

## 2016-11-09 NOTE — Progress Notes (Signed)
Chief Complaint: Rectal pain, Bloating/ "fullness"  HPI:  Jessica Villarreal is a 31 y/o [place] American female who presents to clinic as a new patient for complaint of rectal pain, bloating and a full feeling.   Today, the patient presents to clinic and tells me that on March 18 she was standing in a store and had the urge to have a bowel movement. She rushed home but could not have a bowel movement and instead felt a "lump come out" of her rectum. Patient tells me that after this her bottom became very tender. She started looking on [employer] and diagnosed herself with a hemorrhoid. She started Preparation H the next night and this has eased irritation that she was feeling even when just sitting in the car. Patient tells me there is still "a lump there", but it has gotten smaller with time. She discontinued her Preparation H.    Patient does ask about bloating and gas and explains that sometimes her bowel movements do not feel "complete". The patient describes that often she will have 2 bowel movements a day but they are small. She also describes that sometimes she feels as though she can't eat anything because she is "full feeling".   Patient denies fever, chills, blood in her stool, melena, weight loss, fatigue, anorexia, nausea vomiting, heartburn, reflux or abdominal pain.   Past Medical History:  Diagnosis Date  . Yeast infection     Past Surgical History:  Procedure Laterality Date  . WISDOM TOOTH EXTRACTION      Current Outpatient Prescriptions  Medication Sig Dispense Refill  . Norgestimate-Ethinyl Estradiol Triphasic (TRI-PREVIFEM) 0.18/0.215/0.25 MG-35 MCG tablet Take 1 tablet by mouth as directed.     No current facility-administered medications for this visit.     Allergies as of 11/09/2016  . (No Known Allergies)    Family History: No history of colon cancer or IBD  Social History   Social History  . Marital status: Single    Spouse name: N/A  . Number of children: N/A    . Years of education: N/A   Occupational History  . Not on file.   Social History Main Topics  . Smoking status: Never Smoker  . Smokeless tobacco: Never Used  . Alcohol use No  . Drug use: No  . Sexual activity: Not Currently    Birth control/ protection: None   Other Topics Concern  . Not on file   Social History Narrative  . No narrative on file    Review of Systems:    Constitutional: No weight loss, fever or chills Skin: No rash  Cardiovascular: No chest pain Respiratory: No SOB  Gastrointestinal: See HPI and otherwise negative Genitourinary: No dysuria Neurological: No headache Musculoskeletal: No new muscle or joint pain Hematologic: No bleeding  Psychiatric: Positive history of anxiety   Physical Exam:  Vital signs: BP 106/70   Pulse (!) 104   Ht 5' 1.75" (1.568 m)   Wt 134 lb 8 oz (61 kg)   BMI 24.80 kg/m    Constitutional:   Pleasant African American female appears to be in NAD, Well developed, Well nourished, alert and cooperative Head:  Normocephalic and atraumatic. Eyes:   PEERL, EOMI. No icterus. Conjunctiva pink. Ears:  Normal auditory acuity. Neck:  Supple Throat: Oral cavity and pharynx without inflammation, swelling or lesion.  Respiratory: Respirations even and unlabored. Lungs clear to auscultation bilaterally.   No wheezes, crackles, or rhonchi.  Cardiovascular: Normal S1, S2. No MRG. Regular rate  and rhythm. No peripheral edema, cyanosis or pallor.  Gastrointestinal:  Soft, nondistended, nontender. No rebound or guarding. Normal bowel sounds. No appreciable masses or hepatomegaly. Rectal:  External exam: 1/2 cm thrombosed external hemorrhoid, ttp Msk:  Symmetrical without gross deformities. Without edema, no deformity or joint abnormality.  Neurologic:  Alert and  oriented x4;  grossly normal neurologically.  Skin:   Dry and intact without significant lesions or rashes. Psychiatric: Demonstrates good judgement and reason without abnormal  affect or behaviors.  No recent labs.  Assessment: 1. Thrombosed external hemorrhoid: Seen at time of exam today, likely cause of patient's rectal pain, no fissure seen 2. Constipation: Likely source of patient's bloated " full feeling"  Plan: 1. Recommend patient start fiber supplement. Recommend she stay between 25-35 g of fiber per day through her diet and/or supplement. 2. Recommend the patient start daily Align probiotic for at least 1-2 months 3. Recommend the patient drinks water 6-8 8 ounce glasses of water per day 4. Prescribed Hydrocortisone ointment to be applied to 3 times daily to thrombosed hemorrhoid for the next 1-2 weeks 5. Discussed sitz baths 2-3 times daily 6. Patient to call our clinic if she does not have resolution of symptoms in the next 1-2 weeks 7. Patient to follow in clinic with myself or Dr. Havery Moros as he is the supervising physician today, as needed in the future  Jessica Newer, PA-C [facility] 11/09/2016, 1:21 PM  Cc: Jessica Villarreal, P*

## 2016-11-09 NOTE — Patient Instructions (Signed)
We have sent the following medications to your pharmacy for you to pick up at your convenience: Hydrocortisone cream 2-3 times daily x 1-2 weeks  Please purchase the following medications over the counter and take as directed: Align probiotic once daily  We have given you a high fiber diet handout. Please strive to have 25-30 grams of fiber daily.    Your provider suggest that you drink more water. Try to have at least 6-8 8 oz glasses of water daily.

## 2016-11-10 NOTE — Progress Notes (Signed)
Agree with assessment and plan as outlined. Symptoms ongoing for much longer than 72 hours thus I don't think surgical therapy is needed, especially if she is getting better with conservative therapy. Hopefully recommendations as outlined provide relief, if not, she needs to contact us for reassessment.

## 2017-02-24 ENCOUNTER — Telehealth: Payer: Self-pay | Admitting: Gastroenterology

## 2017-02-24 NOTE — Telephone Encounter (Signed)
Pt has been advised to keep appt to see if she needs to take probiotic.  Pt agreed

## 2017-03-31 ENCOUNTER — Ambulatory Visit: Payer: 59 | Admitting: Family Medicine

## 2017-04-11 ENCOUNTER — Ambulatory Visit: Payer: 59 | Admitting: Gastroenterology

## 2017-04-11 NOTE — Progress Notes (Signed)
No show letter sent.

## 2017-07-19 ENCOUNTER — Encounter (HOSPITAL_COMMUNITY): Payer: Self-pay | Admitting: Family Medicine

## 2017-07-19 ENCOUNTER — Ambulatory Visit (HOSPITAL_COMMUNITY)
Admission: EM | Admit: 2017-07-19 | Discharge: 2017-07-19 | Disposition: A | Discharge status: Home / Self Care | Payer: 59

## 2017-07-19 DIAGNOSIS — R21 Rash and other nonspecific skin eruption: Secondary | ICD-10-CM | POA: Diagnosis not present

## 2017-07-19 DIAGNOSIS — S80861A Insect bite (nonvenomous), right lower leg, initial encounter: Secondary | ICD-10-CM

## 2017-07-19 DIAGNOSIS — W57XXXA Bitten or stung by nonvenomous insect and other nonvenomous arthropods, initial encounter: Secondary | ICD-10-CM | POA: Diagnosis not present

## 2017-07-19 NOTE — ED Triage Notes (Signed)
Pt here for possible bug bite that occurred 2 days ago to left thigh. Reports noticed red streak on her leg and pain in her thigh. Pain is better and red streak gone currently but some discomfort still remains.

## 2017-07-19 NOTE — ED Provider Notes (Signed)
07/19/2017 8:39 PM   DOB: 12/02/1985 / MRN: [id number]  SUBJECTIVE:  Jessica Villarreal is a 31 y.o. female presenting for right leg rash.  States that this started about 2 days ago when she felt that something bit her leg.  Noted a bug bite and then some red streaking which has resolved.  She is concerned because the leg still feels odd.   She has No Known Allergies.   She  has a past medical history of Yeast infection.    She  reports that  has never smoked. she has never used smokeless tobacco. She reports that she does not drink alcohol or use drugs. She  reports that she does not currently engage in sexual activity. She reports using the following method of birth control/protection: None. The patient  has a past surgical history that includes Wisdom tooth extraction.  Her family history includes Diabetes in her maternal grandfather; Heart failure in her maternal grandmother; Lymphoma in her paternal grandmother.  Review of Systems  Constitutional: Negative for chills, diaphoresis and fever.  Eyes: Negative.   Respiratory: Negative for shortness of breath.   Cardiovascular: Negative for chest pain, orthopnea and leg swelling.  Gastrointestinal: Negative for nausea.  Skin: Negative for rash.  Neurological: Negative for dizziness, sensory change, speech change, focal weakness and headaches.    OBJECTIVE:  BP (!) 137/96   Pulse 90   Temp 98.7 F (37.1 C) (Oral)   Resp 18   LMP 07/05/2017   SpO2 100%   Physical Exam  Cardiovascular: Regular rhythm.  Pulmonary/Chest: Effort normal and breath sounds normal.  Musculoskeletal: Normal range of motion. She exhibits no edema, tenderness or deformity.  Skin: Skin is warm and dry. No rash noted.  Vitals reviewed.   No results found for this or any previous visit (from the past 72 hour(s)).  No results found.  ASSESSMENT AND PLAN:  The encounter diagnosis was Insect bite, initial encounter. There are now physical finding on her exam  whatsoever.  Advised that she take benadryl if any itching, and placing Vaseline on the affected area would also be prudent.     The patient is advised to call or return to clinic if she does not see an improvement in symptoms, or to seek the care of the closest emergency department if she worsens with the above plan.   Philis Fendt, MHS, PA-C 07/19/2017 8:39 PM    Tereasa Coop, PA-C 07/19/17 2041

## 2017-07-19 NOTE — Discharge Instructions (Signed)
Okay to place some vaseline on the rash.   Take benadryl if you have any itching.

## 2017-09-21 ENCOUNTER — Encounter (HOSPITAL_COMMUNITY): Payer: Self-pay | Admitting: Emergency Medicine

## 2017-09-21 ENCOUNTER — Ambulatory Visit (HOSPITAL_COMMUNITY)
Admission: EM | Admit: 2017-09-21 | Discharge: 2017-09-21 | Disposition: A | Discharge status: Home / Self Care | Payer: 59 | Attending: Emergency Medicine | Admitting: Emergency Medicine

## 2017-09-21 ENCOUNTER — Other Ambulatory Visit: Payer: Self-pay

## 2017-09-21 DIAGNOSIS — S30817A Abrasion of anus, initial encounter: Secondary | ICD-10-CM

## 2017-09-21 DIAGNOSIS — Z3202 Encounter for pregnancy test, result negative: Secondary | ICD-10-CM

## 2017-09-21 DIAGNOSIS — K6289 Other specified diseases of anus and rectum: Secondary | ICD-10-CM

## 2017-09-21 DIAGNOSIS — T148XXA Other injury of unspecified body region, initial encounter: Secondary | ICD-10-CM

## 2017-09-21 LAB — POCT PREGNANCY, URINE: Preg Test, Ur: NEGATIVE

## 2017-09-21 LAB — OCCULT BLOOD, POC DEVICE: FECAL OCCULT BLD: NEGATIVE

## 2017-09-21 NOTE — ED Provider Notes (Signed)
HPI  SUBJECTIVE:  Jessica Villarreal is a 32 y.o. female who presents with 3 episodes of throbbing, intermittent, seconds long rectal "discomfort" followed by normal nonpainful bowel movements starting today.  States that she saw  bright red blood on the toilet paper twice.  She denies melena, hematochezia, bright red blood per rectum.  States that the rectal pain is there before bowel movements and resolves afterwards.  She states that she wipes herself aggressively.  She has not tried anything for this.  There are no other aggravating or alleviating factors.  She denies abdominal pain but describes abdominal "fullness".  She denies fevers, abdominal distention, back pain, urinary complaints.  No perirectal mass, swelling.  No pain with defecation.  She has had no past medical history diabetes, hypertension, coagulopathy, abdominal surgeries, diverticulitis, diverticulosis.  Patient was seen by GI on 10/2016 with rectal pain, bloating and a "feeling full."  Found to have a thrombosed external hemorrhoid which was managed medically and constipation.  Last menstrual period 1/13.  Denies possibility of being pregnant.  PMD: None.  GI: [facility].  Past Medical History:  Diagnosis Date  . Yeast infection     Past Surgical History:  Procedure Laterality Date  . WISDOM TOOTH EXTRACTION      Family History  Problem Relation Age of Onset  . Heart failure Maternal Grandmother   . Diabetes Maternal Grandfather   . Lymphoma Paternal Grandmother   . Colon cancer Neg Hx   . Stomach cancer Neg Hx   . Rectal cancer Neg Hx   . Esophageal cancer Neg Hx   . Liver cancer Neg Hx     Social History   Tobacco Use  . Smoking status: Never Smoker  . Smokeless tobacco: Never Used  Substance Use Topics  . Alcohol use: No    Alcohol/week: 0.6 oz    Types: 1 Glasses of wine per week  . Drug use: No    No current facility-administered medications for this encounter.   Current Outpatient Medications:  .   hydrocortisone (ANUSOL-HC) 2.5 % rectal cream, Place 1 application rectally 2 (two) times daily., Disp: 30 g, Rfl: 1 .  Norgestimate-Ethinyl Estradiol Triphasic (TRI-PREVIFEM) 0.18/0.215/0.25 MG-35 MCG tablet, Take 1 tablet by mouth as directed., Disp: , Rfl:   No Known Allergies   ROS  As noted in HPI.   Physical Exam  BP 127/88 (BP Location: Left Arm)   Pulse 75   Temp 98.6 F (37 C) (Oral)   Resp 18   LMP 08/31/2017   SpO2 100%   Constitutional: Well developed, well nourished, no acute distress Eyes:  EOMI, conjunctiva normal bilaterally HENT: Normocephalic, atraumatic,mucus membranes moist Respiratory: Normal inspiratory effort Cardiovascular: Normal rate GI: normal Appearance, soft, nontender, nondistended.  Negative Murphy, negative McBurney.  Normal bowel sounds.  No guarding, rebound. Back: No CVAT Rectal.  Positive small abrasion around the rectum.  Positive skin tag, no rectal fissures.  Normal rectal tone, soft yellow stool in vault.  Hemoccult negative.  No rectal masses. skin: No rash, skin intact Musculoskeletal: no deformities Neurologic: Alert & oriented x 3, no focal neuro deficits Psychiatric: Speech and behavior appropriate   ED Course   Medications - No data to display  Orders Placed This Encounter  Procedures  . Occult blood, poc device    Standing Status:   Standing    Number of Occurrences:   1  . Pregnancy, urine POC    Standing Status:   Standing  Number of Occurrences:   1    Results for orders placed or performed during the hospital encounter of 09/21/17 (from the past 24 hour(s))  Occult blood, poc device     Status: None   Collection Time: 09/21/17  7:45 PM  Result Value Ref Range   Fecal Occult Bld NEGATIVE NEGATIVE  Pregnancy, urine POC     Status: None   Collection Time: 09/21/17  7:46 PM  Result Value Ref Range   Preg Test, Ur NEGATIVE NEGATIVE   No results found.  ED Clinical Impression  Abrasion  Rectal  pain   ED Assessment/Plan  Outside records reviewed as noted in HPI.  Checking a pregnancy because patient is not sure if she could be pregnant or not.  Unsure as to where the rectal discomfort is coming from, however it is not appear to be tenesmus.  She has no evidence of a surgical abdomen.  No evidence of an external or internal hemorrhoid, GI bleed.  Suspect that the bleeding is coming from aggressive wiping.  Advised using moist wipes after stooling or rinsing off in the shower.  Will have patient monitor for return of symptoms.  Providing primary care referral list for routine care. Discussed labs,  MDM, plan and followup with patient. Discussed sn/sx that should prompt return to the ED. patient agrees with plan.   No orders of the defined types were placed in this encounter.   *This clinic note was created using Dragon dictation software. Therefore, there may be occasional mistakes despite careful proofreading.   ?   Melynda Ripple, MD 09/22/17 1141

## 2017-09-21 NOTE — Discharge Instructions (Signed)
Use moist wipes after stooling or rinse off in the shower.  Do not wipe yourself aggressively.  Monitor for return of your symptoms.  Go immediately to the ER for the signs and symptoms we discussed.  Below is a list of primary care practices who are taking new patients for you to follow-up with. [facility] [place] [place], [place] [address] [phone]  [facility] [phone] [place] [address]  [facility]: [place]  [phone]  [facility]: [place]   [phone]  [facility]: [address] [place]  [phone]  [facility] : [address] [place] [phone]  [facility]: [address] [place] [address] [phone]  [facility]: [place] [phone]  Dr. Blanchie Serve [place]  [phone]  Dr. Benito Mccreedy, [facility]. [place], [place] [address]  [phone]  Go to www.goodrx.com to look up your medications. This will give you a list of where you can find your prescriptions at the most affordable prices. Or ask the pharmacist what the cash price is, or if they have any other discount programs available to help make your medication more affordable. This can be less expensive than what you would pay with insurance.

## 2017-09-21 NOTE — ED Triage Notes (Signed)
Pt states "yesterday I ate a payday chocolate bar with peanuts, and some bbq wings, a couple hours after that my stomach started churning, I woke up out of my sleep because I feel like I needed a bowel movement. I also felt cold. I feel fine but my stomach is still a little upset. I also noticed three times that i've had rectal pain. I've not been having diarrhea. I noticed some drops of blood while wiping.".

## 2017-10-03 ENCOUNTER — Ambulatory Visit: Payer: Self-pay | Admitting: Physician Assistant

## 2017-10-14 ENCOUNTER — Ambulatory Visit: Payer: Self-pay | Admitting: Physician Assistant

## 2017-10-15 ENCOUNTER — Emergency Department (HOSPITAL_COMMUNITY): Payer: Self-pay

## 2017-10-15 ENCOUNTER — Encounter (HOSPITAL_COMMUNITY): Payer: Self-pay | Admitting: Emergency Medicine

## 2017-10-15 DIAGNOSIS — R0602 Shortness of breath: Secondary | ICD-10-CM | POA: Insufficient documentation

## 2017-10-15 DIAGNOSIS — R0789 Other chest pain: Secondary | ICD-10-CM | POA: Insufficient documentation

## 2017-10-15 DIAGNOSIS — Z5321 Procedure and treatment not carried out due to patient leaving prior to being seen by health care provider: Secondary | ICD-10-CM | POA: Insufficient documentation

## 2017-10-15 LAB — CBC
HCT: 40.8 % (ref 36.0–46.0)
Hemoglobin: 13.7 g/dL (ref 12.0–15.0)
MCH: 30.2 pg (ref 26.0–34.0)
MCHC: 33.6 g/dL (ref 30.0–36.0)
MCV: 89.9 fL (ref 78.0–100.0)
Platelets: 300 10*3/uL (ref 150–400)
RBC: 4.54 MIL/uL (ref 3.87–5.11)
RDW: 13.6 % (ref 11.5–15.5)
WBC: 6.6 10*3/uL (ref 4.0–10.5)

## 2017-10-15 LAB — I-STAT TROPONIN, ED: Troponin i, poc: 0 ng/mL (ref 0.00–0.08)

## 2017-10-15 LAB — I-STAT BETA HCG BLOOD, ED (MC, WL, AP ONLY): I-stat hCG, quantitative: <5 m[IU]/mL (ref <5)

## 2017-10-15 NOTE — ED Triage Notes (Signed)
Reports right sided chest discomfort that started yesterday with some SOB associated with climbing steps.  Lungs clear in triage.  Has not taken anything for pain.

## 2017-10-16 ENCOUNTER — Emergency Department (HOSPITAL_COMMUNITY)
Admission: EM | Admit: 2017-10-16 | Discharge: 2017-10-16 | Disposition: A | Discharge status: Home / Self Care | Payer: Self-pay | Attending: Emergency Medicine | Admitting: Emergency Medicine

## 2017-10-16 ENCOUNTER — Encounter (HOSPITAL_COMMUNITY): Payer: Self-pay | Admitting: Emergency Medicine

## 2017-10-16 DIAGNOSIS — R072 Precordial pain: Secondary | ICD-10-CM

## 2017-10-16 DIAGNOSIS — R079 Chest pain, unspecified: Secondary | ICD-10-CM

## 2017-10-16 DIAGNOSIS — Z79899 Other long term (current) drug therapy: Secondary | ICD-10-CM | POA: Insufficient documentation

## 2017-10-16 DIAGNOSIS — R091 Pleurisy: Secondary | ICD-10-CM | POA: Insufficient documentation

## 2017-10-16 LAB — BASIC METABOLIC PANEL
Anion gap: 11 (ref 5–15)
BUN: 13 mg/dL (ref 6–20)
CHLORIDE: 105 mmol/L (ref 101–111)
CO2: 20 mmol/L — AB (ref 22–32)
CREATININE: 0.83 mg/dL (ref 0.44–1.00)
Calcium: 9.3 mg/dL (ref 8.9–10.3)
GFR calc Af Amer: >60 mL/min (ref >60)
GFR calc non Af Amer: >60 mL/min (ref >60)
Glucose, Bld: 90 mg/dL (ref 65–99)
Potassium: 3.4 mmol/L — ABNORMAL LOW (ref 3.5–5.1)
Sodium: 136 mmol/L (ref 135–145)

## 2017-10-16 LAB — D-DIMER, QUANTITATIVE: D-Dimer, Quant: <0.27 ug/mL-FEU (ref 0.00–0.50)

## 2017-10-16 MED ORDER — IBUPROFEN 400 MG PO TABS
400.0000 mg | ORAL_TABLET | Freq: Once | ORAL | Status: AC
  Administered 2017-10-16: 400 mg via ORAL
  Filled 2017-10-16: qty 1

## 2017-10-16 MED ORDER — IBUPROFEN 400 MG PO TABS: 400.0000 mg | ORAL_TABLET | Freq: Three times a day (TID) | ORAL | 0 refills | Status: DC | PRN

## 2017-10-16 NOTE — ED Notes (Signed)
Pt decided to leave AMA. Sts she believes she might have left the oven on and has to go check on it. This RN attempted to explain to pt that she should stay for a little while longer and talk with doc. Pt sts "it's a safety thing."

## 2017-10-16 NOTE — ED Provider Notes (Signed)
I did not see or evaluate the patient.  The patient left the emergency department after triage.   Jola Schmidt, MD 10/16/17 [phone]

## 2017-10-16 NOTE — ED Triage Notes (Signed)
Checked in earlier but had to run home for a while.  Reports right sided chest discomfort that started yesterday with some SOB associated with climbing steps.  Lungs clear in triage.  Has not taken anything for pain.

## 2017-10-16 NOTE — ED Provider Notes (Signed)
[facility] EMERGENCY DEPARTMENT Provider Note   CSN: [id number] Arrival date & time: 10/16/17  0212     History   Chief Complaint Chief Complaint  Patient presents with  . Chest Pain    HPI Jessica Villarreal is a 32 y.o. female.  HPI Patient is a 32 year old female presents the emergency department with complaints of pleuritic right-sided chest pain over the past several days.  She felt somewhat short of breath when going up stairs the other day.  No history of DVT or pulmonary embolism.  Pain is not constant.  Pain is mainly present in the anterior right chest with deep breathing.  No recent trauma or injury.  No unilateral leg swelling.  No family history of venous thromboembolic disease.  No family history of early cardiac disease.  Symptoms are mild in severity.  No shortness of breath at this time.  No shortness of breath at rest.  Denies orthopnea.  No new lower extremity swelling   Past Medical History:  Diagnosis Date  . Yeast infection     Patient Active Problem List   Diagnosis Date Noted  . Allergic rhinitis 10/10/2015  . Encounter for screening for respiratory tuberculosis 10/10/2015    Past Surgical History:  Procedure Laterality Date  . WISDOM TOOTH EXTRACTION      OB History    Gravida Para Term Preterm AB Living   1 0     1 0   SAB TAB Ectopic Multiple Live Births     1             Home Medications    Prior to Admission medications   Medication Sig Start Date End Date Taking? Authorizing Provider  hydrocortisone (ANUSOL-HC) 2.5 % rectal cream Place 1 application rectally 2 (two) times daily. 11/09/16   Levin Erp, PA  Norgestimate-Ethinyl Estradiol Triphasic (TRI-PREVIFEM) 0.18/0.215/0.25 MG-35 MCG tablet Take 1 tablet by mouth as directed.    [provider]    Family History Family History  Problem Relation Age of Onset  . Heart failure Maternal Grandmother   . Diabetes Maternal Grandfather   .  Lymphoma Paternal Grandmother   . Colon cancer Neg Hx   . Stomach cancer Neg Hx   . Rectal cancer Neg Hx   . Esophageal cancer Neg Hx   . Liver cancer Neg Hx     Social History Social History   Tobacco Use  . Smoking status: Never Smoker  . Smokeless tobacco: Never Used  Substance Use Topics  . Alcohol use: No    Alcohol/week: 0.6 oz    Types: 1 Glasses of wine per week  . Drug use: No     Allergies   Patient has no known allergies.   Review of Systems Review of Systems  All other systems reviewed and are negative.    Physical Exam Updated Vital Signs BP 120/76   Pulse (!) 109   Temp 98.3 F (36.8 C) (Oral)   Resp 16   Ht 5\' 1"  (1.549 m)   Wt 60.8 kg (134 lb)   SpO2 100%   BMI 25.32 kg/m   Physical Exam  Constitutional: She is oriented to person, place, and time. She appears well-developed and well-nourished.  HENT:  Head: Normocephalic.  Eyes: EOM are normal.  Neck: Normal range of motion.  Cardiovascular: Normal rate, regular rhythm and normal heart sounds.  Pulmonary/Chest: Effort normal and breath sounds normal. No stridor. No respiratory distress. She has no wheezes.  Abdominal: Soft. She exhibits no distension. There is no tenderness.  Musculoskeletal: Normal range of motion. She exhibits no edema or tenderness.  Neurological: She is alert and oriented to person, place, and time.  Psychiatric: She has a normal mood and affect.  Nursing note and vitals reviewed.    ED Treatments / Results  Labs (all labs ordered are listed, but only abnormal results are displayed) Labs Reviewed  D-DIMER, QUANTITATIVE (NOT AT [facility])  Hemoglobin 13.7.   White blood cell count 6.6.   Platelet count 300. Sodium 136 Potassium 3.4 Chloride 105 Glucose 90 BUN 13 Creatinine 0.83  EKG  EKG Interpretation  Date/Time:  Sunday October 16 2017 03:12:01 EST Ventricular Rate:  92 PR Interval:    QRS Duration: 80 QT Interval:  340 QTC Calculation: 421 R  Axis:   77 Text Interpretation:  Sinus rhythm No significant change was found Confirmed by Jola Schmidt [phone]) on 10/16/2017 3:16:23 AM       Radiology Dg Chest 2 View  Result Date: 10/15/2017 CLINICAL DATA:  Right-sided chest pain.  Shortness of breath. EXAM: CHEST  2 VIEW COMPARISON:  07/21/2013 FINDINGS: The cardiomediastinal contours are normal. The lungs are clear. Pulmonary vasculature is normal. No consolidation, pleural effusion, or pneumothorax. No acute osseous abnormalities are seen. IMPRESSION: Unremarkable radiographs of the chest. Electronically Signed   By: Jeb Levering M.D.   On: 10/15/2017 23:59    Procedures Procedures (including critical care time)  Medications Ordered in ED Medications - No data to display   Initial Impression / Assessment and Plan / ED Course  I have reviewed the triage vital signs and the nursing notes.  Pertinent labs & imaging results that were available during my care of the patient were reviewed by me and considered in my medical decision making (see chart for details).     D-dimer negative.  Doubt ACS.  Suspect pleurisy.  Discharged home with anti-inflammatories.  Final Clinical Impressions(s) / ED Diagnoses   Final diagnoses:  None    ED Discharge Orders    None       Jola Schmidt, MD 10/16/17 [phone]

## 2017-12-06 ENCOUNTER — Ambulatory Visit: Payer: Self-pay | Admitting: Gastroenterology

## 2018-01-20 ENCOUNTER — Telehealth: Payer: Self-pay | Admitting: Emergency Medicine

## 2018-01-20 ENCOUNTER — Ambulatory Visit: Payer: Self-pay | Admitting: Gastroenterology

## 2018-01-20 NOTE — Telephone Encounter (Signed)
No show letter sent.

## 2018-06-19 DIAGNOSIS — Z6825 Body mass index (BMI) 25.0-25.9, adult: Secondary | ICD-10-CM | POA: Diagnosis not present

## 2018-06-19 DIAGNOSIS — Z01419 Encounter for gynecological examination (general) (routine) without abnormal findings: Secondary | ICD-10-CM | POA: Diagnosis not present

## 2018-06-29 ENCOUNTER — Ambulatory Visit: Payer: Self-pay | Admitting: Family Medicine

## 2018-07-04 DIAGNOSIS — Z3169 Encounter for other general counseling and advice on procreation: Secondary | ICD-10-CM | POA: Diagnosis not present

## 2018-07-04 DIAGNOSIS — D259 Leiomyoma of uterus, unspecified: Secondary | ICD-10-CM | POA: Diagnosis not present

## 2018-07-26 ENCOUNTER — Ambulatory Visit: Payer: Self-pay | Admitting: Family Medicine

## 2018-08-23 ENCOUNTER — Ambulatory Visit: Payer: 59 | Admitting: Family Medicine

## 2018-08-23 ENCOUNTER — Encounter: Payer: Self-pay | Admitting: Family Medicine

## 2018-08-23 VITALS — BP 102/74 | HR 88 | Temp 98.3°F | Ht 61.0 in | Wt 135.0 lb

## 2018-08-23 DIAGNOSIS — Z113 Encounter for screening for infections with a predominantly sexual mode of transmission: Secondary | ICD-10-CM

## 2018-08-23 DIAGNOSIS — N809 Endometriosis, unspecified: Secondary | ICD-10-CM | POA: Diagnosis not present

## 2018-08-23 DIAGNOSIS — R5383 Other fatigue: Secondary | ICD-10-CM

## 2018-08-23 DIAGNOSIS — Z0000 Encounter for general adult medical examination without abnormal findings: Secondary | ICD-10-CM | POA: Diagnosis not present

## 2018-08-23 DIAGNOSIS — N83201 Unspecified ovarian cyst, right side: Secondary | ICD-10-CM | POA: Diagnosis not present

## 2018-08-23 DIAGNOSIS — J302 Other seasonal allergic rhinitis: Secondary | ICD-10-CM

## 2018-08-23 DIAGNOSIS — Z131 Encounter for screening for diabetes mellitus: Secondary | ICD-10-CM | POA: Diagnosis not present

## 2018-08-23 DIAGNOSIS — Z13220 Encounter for screening for lipoid disorders: Secondary | ICD-10-CM

## 2018-08-23 LAB — CBC WITH DIFFERENTIAL/PLATELET
BASOS PCT: 0.9 % (ref 0.0–3.0)
Basophils Absolute: 0 10*3/uL (ref 0.0–0.1)
Eosinophils Absolute: 0.1 10*3/uL (ref 0.0–0.7)
Eosinophils Relative: 1.7 % (ref 0.0–5.0)
HEMATOCRIT: 39.5 % (ref 36.0–46.0)
Hemoglobin: 13 g/dL (ref 12.0–15.0)
LYMPHS ABS: 1.1 10*3/uL (ref 0.7–4.0)
LYMPHS PCT: 27 % (ref 12.0–46.0)
MCHC: 33 g/dL (ref 30.0–36.0)
MCV: 87.7 fl (ref 78.0–100.0)
MONOS PCT: 5.1 % (ref 3.0–12.0)
Monocytes Absolute: 0.2 10*3/uL (ref 0.1–1.0)
NEUTROS ABS: 2.6 10*3/uL (ref 1.4–7.7)
Neutrophils Relative %: 65.3 % (ref 43.0–77.0)
PLATELETS: 311 10*3/uL (ref 150.0–400.0)
RBC: 4.5 Mil/uL (ref 3.87–5.11)
RDW: 15.8 % — AB (ref 11.5–15.5)
WBC: 4 10*3/uL (ref 4.0–10.5)

## 2018-08-23 LAB — LIPID PANEL
Cholesterol: 165 mg/dL (ref 0–200)
HDL: 51.3 mg/dL (ref >39.00)
LDL Cholesterol: 101 mg/dL — ABNORMAL HIGH (ref 0–99)
NonHDL: 113.94
Total CHOL/HDL Ratio: 3
Triglycerides: 66 mg/dL (ref 0.0–149.0)
VLDL: 13.2 mg/dL (ref 0.0–40.0)

## 2018-08-23 LAB — BASIC METABOLIC PANEL
BUN: 9 mg/dL (ref 6–23)
CO2: 27 mEq/L (ref 19–32)
CREATININE: 0.8 mg/dL (ref 0.40–1.20)
Calcium: 9.6 mg/dL (ref 8.4–10.5)
Chloride: 102 mEq/L (ref 96–112)
GFR: 105.48 mL/min (ref >60.00)
Glucose, Bld: 77 mg/dL (ref 70–99)
Potassium: 4.1 mEq/L (ref 3.5–5.1)
Sodium: 138 mEq/L (ref 135–145)

## 2018-08-23 LAB — VITAMIN D 25 HYDROXY (VIT D DEFICIENCY, FRACTURES): VITD: 26.18 ng/mL — AB (ref 30.00–100.00)

## 2018-08-23 LAB — HEMOGLOBIN A1C: HEMOGLOBIN A1C: 5.7 % (ref 4.6–6.5)

## 2018-08-23 NOTE — Progress Notes (Signed)
Subjective:     Jessica Villarreal is a 33 y.o. female and is here for a comprehensive physical exam. The patient reports no problems. Pt notes h/o seasonal allergies.  Not currently taking anything, used flonase in the past.  Allergies: NKDA  Social history: Patient is single.  Patient is a Writer of [employer].  Pt currently works as a Education officer, museum in [place]. Pt is a G2P0.  Pt endorses social EtOH use. Pt denies tobacco and drug use.  Influenza vaccine 2013.   TB test 2017 Pap Novemeber 2019.  Pt denies h/o abnormal paps LMP:  Started 08/20/2018, still on. Dental--[facility]  Social History   Socioeconomic History  . Marital status: Single    Spouse name: Not on file  . Number of children: 0  . Years of education: Not on file  . Highest education level: Not on file  Occupational History  . 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 Financial resource strain: Not on file  . Food insecurity:    Worry: Not on file    Inability: Not on file  . Transportation needs:    Medical: Not on file    Non-medical: Not on file  Tobacco Use  . Smoking status: Never Smoker  . Smokeless tobacco: Never Used  Substance and Sexual Activity  . Alcohol use: No    Alcohol/week: 1.0 standard drinks    Types: 1 Glasses of wine per week  . Drug use: No  . Sexual activity: Not Currently    Birth control/protection: None  Lifestyle  . Physical activity:    Days per week: Not on file    Minutes per session: Not on file  . Stress: Not on file  Relationships  . Social connections:    Talks on phone: Not on file    Gets together: Not on file    Attends religious service: Not on file    Active member of club or organization: Not on file    Attends meetings of clubs or organizations: Not on file    Relationship status: Not on file  . Intimate partner violence:    Fear of current or ex partner: Not on file    Emotionally abused:  Not on file    Physically abused: Not on file    Forced sexual activity: Not on file  Other Topics Concern  . Not on file  Social History Narrative  . Not on file   Health Maintenance  Topic Date Due  . HIV Screening  06/15/1999  . TETANUS/TDAP  06/15/2003  . PAP SMEAR-Modifier  06/14/2005  . INFLUENZA VACCINE  03/16/2018    The following portions of the patient's history were reviewed and updated as appropriate: allergies, current medications, past family history, past medical history, past social history, past surgical history and problem list.  Review of Systems Pertinent items noted in HPI and remainder of comprehensive ROS otherwise negative.   Objective:    BP 102/74 (BP Location: Left Arm, Patient Position: Sitting, Cuff Size: Normal)   Pulse 88   Temp 98.3 F (36.8 C) (Oral)   Ht 5\' 1"  (1.549 m)   Wt 135 lb (61.2 kg)   LMP 08/20/2018 (Exact Date)   SpO2 98%   BMI 25.51 kg/m  General appearance: alert, cooperative, appears stated age and no distress Head: Normocephalic, without obvious abnormality, atraumatic Eyes: conjunctivae/corneas clear. PERRL, EOM's intact. Fundi benign. Ears: normal TM's and external  ear canals both ears Nose: Nares normal. Septum midline. Mucosa normal. No drainage or sinus tenderness. Throat: lips, mucosa, and tongue normal; teeth and gums normal Neck: no adenopathy, no carotid bruit, no JVD, supple, symmetrical, trachea midline and thyroid not enlarged, symmetric, no tenderness/mass/nodules Lungs: clear to auscultation bilaterally Heart: regular rate and rhythm, S1, S2 normal, no murmur, click, rub or gallop Abdomen: soft, non-tender; bowel sounds normal; no masses,  no organomegaly Extremities: extremities normal, atraumatic, no cyanosis or edema Skin: Skin color, texture, turgor normal. No rashes or lesions Neurologic: Alert and oriented X 3, normal strength and tone. Normal symmetric reflexes. Normal coordination and gait     Assessment:    Healthy female exam.      Plan:     Anticipatory guidance given including wearing seatbelts, smoke detectors in the home, increasing physical activity, increasing p.o. intake of water and vegetables. -will obtain labs -pap up to date -Routine STI screening -Given handout -Next CPE in 1 year See After Visit Summary for Counseling Recommendations   Fatigue -Discussed various causes -will obtain labs including vitamin D, CBC. -Discussed obtaining TSH and free T4, however patient declines at this time stating was recently normal -Given handout  Follow-up PRN  Grier Mitts, MD

## 2018-08-23 NOTE — Patient Instructions (Signed)
Preventive Care 18-39 Years, Female Preventive care refers to lifestyle choices and visits with your health care provider that can promote health and wellness. What does preventive care include?   A yearly physical exam. This is also called an annual well check.  Dental exams once or twice a year.  Routine eye exams. Ask your health care provider how often you should have your eyes checked.  Personal lifestyle choices, including: ? Daily care of your teeth and gums. ? Regular physical activity. ? Eating a healthy diet. ? Avoiding tobacco and drug use. ? Limiting alcohol use. ? Practicing safe sex. ? Taking vitamin and mineral supplements as recommended by your health care provider. What happens during an annual well check? The services and screenings done by your health care provider during your annual well check will depend on your age, overall health, lifestyle risk factors, and family history of disease. Counseling Your health care provider may ask you questions about your:  Alcohol use.  Tobacco use.  Drug use.  Emotional well-being.  Home and relationship well-being.  Sexual activity.  Eating habits.  Work and work environment.  Method of birth control.  Menstrual cycle.  Pregnancy history. Screening You may have the following tests or measurements:  Height, weight, and BMI.  Diabetes screening. This is done by checking your blood sugar (glucose) after you have not eaten for a while (fasting).  Blood pressure.  Lipid and cholesterol levels. These may be checked every 5 years starting at age 20.  Skin check.  Hepatitis C blood test.  Hepatitis B blood test.  Sexually transmitted disease (STD) testing.  BRCA-related cancer screening. This may be done if you have a family history of breast, ovarian, tubal, or peritoneal cancers.  Pelvic exam and Pap test. This may be done every 3 years starting at age 21. Starting at age 30, this may be done every 5  years if you have a Pap test in combination with an HPV test. Discuss your test results, treatment options, and if necessary, the need for more tests with your health care provider. Vaccines Your health care provider may recommend certain vaccines, such as:  Influenza vaccine. This is recommended every year.  Tetanus, diphtheria, and acellular pertussis (Tdap, Td) vaccine. You may need a Td booster every 10 years.  Varicella vaccine. You may need this if you have not been vaccinated.  HPV vaccine. If you are 26 or younger, you may need three doses over 6 months.  Measles, mumps, and rubella (MMR) vaccine. You may need at least one dose of MMR. You may also need a second dose.  Pneumococcal 13-valent conjugate (PCV13) vaccine. You may need this if you have certain conditions and were not previously vaccinated.  Pneumococcal polysaccharide (PPSV23) vaccine. You may need one or two doses if you smoke cigarettes or if you have certain conditions.  Meningococcal vaccine. One dose is recommended if you are age 19-21 years and a first-year college student living in a residence hall, or if you have one of several medical conditions. You may also need additional booster doses.  Hepatitis A vaccine. You may need this if you have certain conditions or if you travel or work in places where you may be exposed to hepatitis A.  Hepatitis B vaccine. You may need this if you have certain conditions or if you travel or work in places where you may be exposed to hepatitis B.  Haemophilus influenzae type b (Hib) vaccine. You may need this if you   have certain risk factors. Talk to your health care provider about which screenings and vaccines you need and how often you need them. This information is not intended to replace advice given to you by your health care provider. Make sure you discuss any questions you have with your health care provider. Document Released: 09/28/2001 Document Revised: 03/15/2017  Document Reviewed: 06/03/2015 Elsevier Interactive Patient Education  2019 [employer].  Fatigue If you have fatigue, you feel tired all the time and have a lack of energy or a lack of motivation. Fatigue may make it difficult to start or complete tasks because of exhaustion. In general, occasional or mild fatigue is often a normal response to activity or life. However, long-lasting (chronic) or extreme fatigue may be a symptom of a medical condition. Follow these instructions at home: General instructions  Watch your fatigue for any changes.  Go to bed and get up at the same time every day.  Avoid fatigue by pacing yourself during the day and getting enough sleep at night.  Maintain a healthy weight. Medicines  Take over-the-counter and prescription medicines only as told by your health care provider.  Take a multivitamin, if told by your health care provider.  Do not use herbal or dietary supplements unless they are approved by your health care provider. Activity   Exercise regularly, as told by your health care provider.  Use or practice techniques to help you relax, such as yoga, tai chi, meditation, or massage therapy. Eating and drinking   Avoid heavy meals in the evening.  Eat a well-balanced diet, which includes lean proteins, whole grains, plenty of fruits and vegetables, and low-fat dairy products.  Avoid consuming too much caffeine.  Avoid the use of alcohol.  Drink enough fluid to keep your urine pale yellow. Lifestyle  Change situations that cause you stress. Try to keep your work and personal schedule in balance.  Do not use any products that contain nicotine or tobacco, such as cigarettes and e-cigarettes. If you need help quitting, ask your health care provider.  Do not use drugs. Contact a health care provider if:  Your fatigue does not get better.  You have a fever.  You suddenly lose or gain weight.  You have headaches.  You have trouble  falling asleep or sleeping through the night.  You feel angry, guilty, anxious, or sad.  You are unable to have a bowel movement (constipation).  Your skin is dry.  You have swelling in your legs or another part of your body. Get help right away if:  You feel confused.  Your vision is blurry.  You feel faint or you pass out.  You have a severe headache.  You have severe pain in your abdomen, your back, or the area between your waist and hips (pelvis).  You have chest pain, shortness of breath, or an irregular or fast heartbeat.  You are unable to urinate, or you urinate less than normal.  You have abnormal bleeding, such as bleeding from the rectum, vagina, nose, lungs, or nipples.  You vomit blood.  You have thoughts about hurting yourself or others. If you ever feel like you may hurt yourself or others, or have thoughts about taking your own life, get help right away. You can go to your nearest emergency department or call:  Your local emergency services (911 in the [place]  A suicide crisis helpline, such as the [facility] at 601-715-9992. This is open 24 hours a day. Summary  If you have fatigue, you feel tired all the time and have a lack of energy or a lack of motivation.  Fatigue may make it difficult to start or complete tasks because of exhaustion.  Long-lasting (chronic) or extreme fatigue may be a symptom of a medical condition.  Exercise regularly, as told by your health care provider.  Change situations that cause you stress. Try to keep your work and personal schedule in balance. This information is not intended to replace advice given to you by your health care provider. Make sure you discuss any questions you have with your health care provider. Document Released: 05/30/2007 Document Revised: 04/27/2017 Document Reviewed: 04/27/2017 Elsevier Interactive Patient Education  [employer].

## 2018-08-24 ENCOUNTER — Encounter: Payer: Self-pay | Admitting: Family Medicine

## 2018-08-24 ENCOUNTER — Other Ambulatory Visit: Payer: Self-pay | Admitting: Family Medicine

## 2018-08-24 DIAGNOSIS — E559 Vitamin D deficiency, unspecified: Secondary | ICD-10-CM

## 2018-08-24 LAB — HIV ANTIBODY (ROUTINE TESTING W REFLEX): HIV 1&2 Ab, 4th Generation: NONREACTIVE

## 2018-08-24 LAB — RPR: RPR: NONREACTIVE

## 2018-08-24 LAB — C. TRACHOMATIS/N. GONORRHOEAE RNA
C. trachomatis RNA, TMA: NOT DETECTED
N. GONORRHOEAE RNA, TMA: NOT DETECTED

## 2018-08-24 MED ORDER — VITAMIN D (ERGOCALCIFEROL) 1.25 MG (50000 UNIT) PO CAPS: 50000.0000 [IU] | ORAL_CAPSULE | ORAL | 0 refills | Status: DC

## 2018-08-25 ENCOUNTER — Other Ambulatory Visit: Payer: Self-pay

## 2018-08-25 DIAGNOSIS — E559 Vitamin D deficiency, unspecified: Secondary | ICD-10-CM

## 2018-08-25 MED ORDER — VITAMIN D (ERGOCALCIFEROL) 1.25 MG (50000 UNIT) PO CAPS: 50000.0000 [IU] | ORAL_CAPSULE | ORAL | 0 refills | Status: DC

## 2018-09-13 ENCOUNTER — Other Ambulatory Visit: Payer: Self-pay | Admitting: Obstetrics and Gynecology

## 2018-09-24 DIAGNOSIS — M545 Low back pain: Secondary | ICD-10-CM | POA: Diagnosis not present

## 2018-09-24 DIAGNOSIS — N898 Other specified noninflammatory disorders of vagina: Secondary | ICD-10-CM | POA: Diagnosis not present

## 2018-10-31 DIAGNOSIS — N83201 Unspecified ovarian cyst, right side: Secondary | ICD-10-CM | POA: Diagnosis not present

## 2018-10-31 DIAGNOSIS — N809 Endometriosis, unspecified: Secondary | ICD-10-CM | POA: Diagnosis not present

## 2018-10-31 DIAGNOSIS — D259 Leiomyoma of uterus, unspecified: Secondary | ICD-10-CM | POA: Diagnosis not present

## 2018-11-13 ENCOUNTER — Ambulatory Visit (INDEPENDENT_AMBULATORY_CARE_PROVIDER_SITE_OTHER): Payer: 59 | Admitting: Gastroenterology

## 2018-11-13 ENCOUNTER — Other Ambulatory Visit: Payer: Self-pay

## 2018-11-13 DIAGNOSIS — K625 Hemorrhage of anus and rectum: Secondary | ICD-10-CM | POA: Diagnosis not present

## 2018-11-13 DIAGNOSIS — R194 Change in bowel habit: Secondary | ICD-10-CM | POA: Diagnosis not present

## 2018-11-13 NOTE — Progress Notes (Signed)
THIS ENCOUNTER IS A VIRTUAL VISIT DUE TO COVID-19 - PATIENT WAS NOT SEEN IN THE OFFICE. PATIENT HAS CONSENTED TO VIRTUAL VISIT / TELEMEDICINE VISIT   Location of patient: home Location of provider: office Name of referring provider: follow up NA Persons participating: myself, patient Time spent on call:  minutes   Interactive audio and video telecommunications were attempted between this provider and patient, however failed, due to patient having technical difficulties OR patient did not have access to video capability. We continued and completed visit with audio only.    HPI :  33 y/o female here for a follow up visit. She was last seen in March 2018 for a thrombosed external hemorrhoid and constipation.  She reports she had been doing well and in usual state of health until a few weeks ago. Symptoms initially started with increased gas and bloating about 3 weeks ago.Then for about a week she had increased stool frequency. Eating certain foods it would stimulate a bowel movement. Stool form remained normal however, no loose stools. She had noticed some mucous in her stool over the past week, as well as a few episodes of bright red blood in the toilet which scared her. Seemed to be a small amount of blood attached to her stool. She has not had any blood in the stool before. No constipation. She thinks her food was not "digesting" as it should be, although her symptoms have seemed to be improved in recent days and she has been feeling a bit better. No rectal pain or discomfort with this. She has an "abnormal sensation" on left bottom going down her left leg, one time occurrence. No back pain currently, she had back pain about a month ago, told she had sciatic nerve issue. No abdominal pains. She has not changed her diet recently, she is trying to eat healthy and eat a high fiber diet. She has used some Activia yogurt given these symptoms.   She is not taking advil / ibuprofen, etc. No other  major operations.   No FH of colon cancer.     Past Medical History:  Diagnosis Date  . Hay fever   . Yeast infection      Past Surgical History:  Procedure Laterality Date  . WISDOM TOOTH EXTRACTION     Family History  Problem Relation Age of Onset  . Heart failure Maternal Grandmother   . Diabetes Maternal Grandfather   . Lymphoma Paternal Grandmother   . Colon cancer Neg Hx   . Stomach cancer Neg Hx   . Rectal cancer Neg Hx   . Esophageal cancer Neg Hx   . Liver cancer Neg Hx    Social History   Tobacco Use  . Smoking status: Never Smoker  . Smokeless tobacco: Never Used  Substance Use Topics  . Alcohol use: No    Alcohol/week: 1.0 standard drinks    Types: 1 Glasses of wine per week  . Drug use: No   Current Outpatient Medications  Medication Sig Dispense Refill  . Vitamin D, Ergocalciferol, (DRISDOL) 1.25 MG (50000 UT) CAPS capsule Take 1 capsule (50,000 Units total) by mouth every 7 (seven) days. 12 capsule 0   No current facility-administered medications for this visit.    No Known Allergies   Review of Systems: All systems reviewed and negative except where noted in HPI.   Lab Results  Component Value Date   WBC 4.0 08/23/2018   HGB 13.0 08/23/2018   HCT 39.5 08/23/2018  MCV 87.7 08/23/2018   PLT 311.0 08/23/2018    Lab Results  Component Value Date   CREATININE 0.80 08/23/2018   BUN 9 08/23/2018   NA 138 08/23/2018   K 4.1 08/23/2018   CL 102 08/23/2018   CO2 27 08/23/2018    No results found for: ALT, AST, GGT, ALKPHOS, BILITOT    Physical Exam: NA  ASSESSMENT AND PLAN:  34 y/o female here for reassessment of the following issues:  Change in bowel habits / rectal bleeding - symptoms of rectal bleeding, mucous in the stools, with increased stool frequency and some bloating, no diarrhea. Feeling somewhat better now but she is quite concerned about the bleeding. I counseled her on differential. Bleeding may likely could have  been due to hemorrhoids which she has had in the past but she is anxious about this and wants further evaluation with colonoscopy. We discussed risks /benefits of colonoscopy, she wanted to proceed with it. Given the current COVID-19 outbreak and that her bleeding symptoms seemed to have resolved, I think okay to wait a few weeks and do this once our center opens for elective procedures. She agreed with this. If her symptoms recur or she has significant bleeding in the interim, she should contact me and can try and do it sooner. Otherwise, recommended she try some Citrucel to provide some regularity to her stools and minimize straining. She can try a low FODMAP diet if her bloating recurs. She agreed. Will contact her about scheduling in the upcoming weeks.   [place] Cellar, MD [facility]

## 2018-11-14 DIAGNOSIS — L700 Acne vulgaris: Secondary | ICD-10-CM | POA: Diagnosis not present

## 2018-11-14 DIAGNOSIS — L819 Disorder of pigmentation, unspecified: Secondary | ICD-10-CM | POA: Diagnosis not present

## 2018-12-14 ENCOUNTER — Other Ambulatory Visit: Payer: Self-pay

## 2018-12-14 ENCOUNTER — Ambulatory Visit (AMBULATORY_SURGERY_CENTER): Payer: Self-pay

## 2018-12-14 ENCOUNTER — Encounter: Payer: Self-pay | Admitting: Gastroenterology

## 2018-12-14 DIAGNOSIS — K625 Hemorrhage of anus and rectum: Secondary | ICD-10-CM

## 2018-12-14 NOTE — Progress Notes (Signed)
Denies allergies to eggs or soy products. Denies complication of anesthesia or sedation. Denies use of weight loss medication. Denies use of O2.   Emmi instructions given for colonoscopy.  Pre-Visit was conducted by phone due to Covid 19. Instructions were reviewed with patient. Jessica Villarreal will pick up the instructions on Friday because her procedure is on Tuesday. A 15.00 coupon for Suprep was given to the patient. Jessica Villarreal was encouraged to call if she had any questions or concerns.

## 2018-12-15 ENCOUNTER — Telehealth: Payer: Self-pay | Admitting: Gastroenterology

## 2018-12-15 ENCOUNTER — Other Ambulatory Visit: Payer: Self-pay

## 2018-12-15 MED ORDER — NA SULFATE-K SULFATE-MG SULF 17.5-3.13-1.6 GM/177ML PO SOLN: 1.0000 | Freq: Once | ORAL | 0 refills | Status: AC

## 2018-12-15 NOTE — Telephone Encounter (Signed)
Pt is having colonoscopy 5/5 and needs prep prescription sent to [facility] on [address].

## 2018-12-15 NOTE — Telephone Encounter (Signed)
Prescription sent to [facility]. Patient had a question regarding the prep and her diet. Questions answered.

## 2018-12-18 ENCOUNTER — Telehealth: Payer: Self-pay | Admitting: *Deleted

## 2018-12-18 NOTE — Telephone Encounter (Signed)
Covid-19 travel screening questions  Have you traveled in the last 14 days? no If yes where?  Do you now or have you had a fever in the last 14 days? no  Do you have any respiratory symptoms of shortness of breath or cough now or in the last 14 days? no  Do you have any family members or close contacts with diagnosed or suspected Covid-19? No  Pt is aware of that care partner will be waiting in the car during procedure.  She will be wearing a mask into the building

## 2018-12-19 ENCOUNTER — Encounter: Payer: Self-pay | Admitting: Gastroenterology

## 2018-12-19 ENCOUNTER — Other Ambulatory Visit: Payer: Self-pay

## 2018-12-19 ENCOUNTER — Ambulatory Visit (AMBULATORY_SURGERY_CENTER): Payer: 59 | Admitting: Gastroenterology

## 2018-12-19 VITALS — BP 114/73 | HR 80 | Temp 98.7°F | Resp 16 | Ht 61.0 in | Wt 135.0 lb

## 2018-12-19 DIAGNOSIS — K639 Disease of intestine, unspecified: Secondary | ICD-10-CM

## 2018-12-19 DIAGNOSIS — K625 Hemorrhage of anus and rectum: Secondary | ICD-10-CM | POA: Diagnosis not present

## 2018-12-19 DIAGNOSIS — K6289 Other specified diseases of anus and rectum: Secondary | ICD-10-CM

## 2018-12-19 MED ORDER — SODIUM CHLORIDE 0.9 % IV SOLN: 500.0000 mL | Freq: Once | INTRAVENOUS | Status: DC

## 2018-12-19 NOTE — Patient Instructions (Signed)
Bleeding probably from hemorrhoids.  Avoid constipation.  Consider fiber supplement daily. No driving today. Report adverse symptoms to [phone]    YOU HAD AN ENDOSCOPIC PROCEDURE TODAY AT [facility]:   Refer to the procedure report that was given to you for any specific questions about what was found during the examination.  If the procedure report does not answer your questions, please call your gastroenterologist to clarify.  If you requested that your care partner not be given the details of your procedure findings, then the procedure report has been included in a sealed envelope for you to review at your convenience later.  YOU SHOULD EXPECT: Some feelings of bloating in the abdomen. Passage of more gas than usual.  Walking can help get rid of the air that was put into your GI tract during the procedure and reduce the bloating. If you had a lower endoscopy (such as a colonoscopy or flexible sigmoidoscopy) you may notice spotting of blood in your stool or on the toilet paper. If you underwent a bowel prep for your procedure, you may not have a normal bowel movement for a few days.  Please Note:  You might notice some irritation and congestion in your nose or some drainage.  This is from the oxygen used during your procedure.  There is no need for concern and it should clear up in a day or so.  SYMPTOMS TO REPORT IMMEDIATELY:   Following lower endoscopy (colonoscopy or flexible sigmoidoscopy):  Excessive amounts of blood in the stool  Significant tenderness or worsening of abdominal pains  Swelling of the abdomen that is new, acute  Fever of 100F or higher   For urgent or emergent issues, a gastroenterologist can be reached at any hour by calling [phone].   DIET:  We do recommend a small meal at first, but then you may proceed to your regular diet.  Drink plenty of fluids but you should avoid alcoholic beverages for 24 hours.  ACTIVITY:  You should plan to  take it easy for the rest of today and you should NOT DRIVE or use heavy machinery until tomorrow (because of the sedation medicines used during the test).    FOLLOW UP: Our staff will call the number listed on your records the next business day following your procedure to check on you and address any questions or concerns that you may have regarding the information given to you following your procedure. If we do not reach you, we will leave a message.  However, if you are feeling well and you are not experiencing any problems, there is no need to return our call.  We will assume that you have returned to your regular daily activities without incident. We will be calling you two weeks following your procedure to see if you have developed any symptoms of the COVID-19.  If you develop any symptoms before then, please let us know.  If any biopsies were taken you will be contacted by phone or by letter within the next 1-3 weeks.  Please call us at [phone] if you have not heard about the biopsies in 3 weeks.    SIGNATURES/CONFIDENTIALITY: You and/or your care partner have signed paperwork which will be entered into your electronic medical record.  These signatures attest to the fact that that the information above on your After Visit Summary has been reviewed and is understood.  Full responsibility of the confidentiality of this discharge information lies with you and/or your care-partner.

## 2018-12-19 NOTE — Progress Notes (Signed)
Called to room to assist during endoscopic procedure.  Patient ID and intended procedure confirmed with present staff. Received instructions for my participation in the procedure from the performing physician.  

## 2018-12-19 NOTE — Op Note (Signed)
[facility] Patient Name: Jessica Villarreal Procedure Date: 12/19/2018 1:24 PM MRN: [id number] Endoscopist: Remo Lipps P. Havery Moros , MD Age: 33 Referring MD:  Date of Birth: 1985-09-20 Gender: Female Account #: [id number] Procedure:                Colonoscopy Indications:              Rectal bleeding, Change in bowel habits - symptoms                            have since resolved since last office visit Medicines:                Monitored Anesthesia Care Procedure:                Pre-Anesthesia Assessment:                           - Prior to the procedure, a History and Physical                            was performed, and patient medications and                            allergies were reviewed. The patient's tolerance of                            previous anesthesia was also reviewed. The risks                            and benefits of the procedure and the sedation                            options and risks were discussed with the patient.                            All questions were answered, and informed consent                            was obtained. Prior Anticoagulants: The patient has                            taken no previous anticoagulant or antiplatelet                            agents. ASA Grade Assessment: I - A normal, healthy                            patient. After reviewing the risks and benefits,                            the patient was deemed in satisfactory condition to                            undergo the procedure.  After obtaining informed consent, the colonoscope                            was passed under direct vision. Throughout the                            procedure, the patient's blood pressure, pulse, and                            oxygen saturations were monitored continuously. The                            Colonoscope was introduced through the anus and                            advanced to the the  terminal ileum, with                            identification of the appendiceal orifice and IC                            valve. The colonoscopy was performed without                            difficulty. The patient tolerated the procedure                            well. The quality of the bowel preparation was                            good. The terminal ileum, ileocecal valve,                            appendiceal orifice, and rectum were photographed. Scope In: 1:34:01 PM Scope Out: 1:57:47 PM Scope Withdrawal Time: 0 hours 19 minutes 41 seconds  Total Procedure Duration: 0 hours 23 minutes 46 seconds  Findings:                 A skin tag was found on perianal exam.                           The terminal ileum appeared normal.                           A suspected area of prominent fold versus extrinsic                            compression or lipomatous change was found in the                            recto-sigmoid colon. The overlying mucosa appeared                            normal. Biopsies were taken with a cold forceps for  histology.                           Anal papilla(e) were hypertrophied. One of these                            had some subtle polypoid changes. Biopsies were                            taken with a cold forceps for histology to rule out                            AIN.                           Internal hemorrhoids were found during retroflexion.                           The exam was otherwise without abnormality. Complications:            No immediate complications. Estimated blood loss:                            Minimal. Estimated Blood Loss:     Estimated blood loss was minimal. Impression:               - Perianal skin tags found on perianal exam.                           - The examined portion of the ileum was normal.                           - Mucosa in the recto-sigmoid colon as outlined.                             Biopsied.                           - Anal papilla(e) were hypertrophied. Biopsied to                            rule out AIN.                           - Internal hemorrhoids.                           - The examination was otherwise normal.                           Suspect bleeding symptoms were due to hemorrhoids.                            No evidence of colitis / mass lesion / polyp. Recommendation:           - Patient has a contact number available for  emergencies. The signs and symptoms of potential                            delayed complications were discussed with the                            patient. Return to normal activities tomorrow.                            Written discharge instructions were provided to the                            patient.                           - Resume previous diet.                           - Continue present medications.                           - Await pathology results with further                            recommendations Remo Lipps P. Jessica Twichell, MD 12/19/2018 2:04:21 PM This report has been signed electronically.

## 2018-12-19 NOTE — Progress Notes (Signed)
PT taken to PACU. Monitors in place. VSS. Report given to RN. 

## 2018-12-21 ENCOUNTER — Telehealth: Payer: Self-pay

## 2018-12-21 NOTE — Telephone Encounter (Signed)
  Follow up Call-  Call back number 12/19/2018  Post procedure Call Back phone  # [phone]  Permission to leave phone message Yes  Some recent data might be hidden     Patient questions:  Do you have a fever, pain , or abdominal swelling? No. Pain Score  0 *  Have you tolerated food without any problems? Yes.    Have you been able to return to your normal activities? Yes.    Do you have any questions about your discharge instructions: Diet   No. Medications  No. Follow up visit  No.  Do you have questions or concerns about your Care? No.  Actions: * If pain score is 4 or above: No action needed, pain <4.  No problems noted per pt. maw

## 2018-12-28 ENCOUNTER — Telehealth: Payer: Self-pay

## 2018-12-28 ENCOUNTER — Encounter: Payer: Self-pay | Admitting: Gastroenterology

## 2019-01-30 ENCOUNTER — Emergency Department (HOSPITAL_COMMUNITY): Payer: 59

## 2019-01-30 ENCOUNTER — Observation Stay (HOSPITAL_COMMUNITY)
Admission: EM | Admit: 2019-01-30 | Discharge: 2019-02-01 | Disposition: A | Discharge status: Home / Self Care | Payer: 59 | Attending: General Surgery | Admitting: General Surgery

## 2019-01-30 ENCOUNTER — Other Ambulatory Visit: Payer: Self-pay

## 2019-01-30 ENCOUNTER — Observation Stay (HOSPITAL_COMMUNITY): Payer: 59

## 2019-01-30 DIAGNOSIS — S91311A Laceration without foreign body, right foot, initial encounter: Secondary | ICD-10-CM | POA: Diagnosis not present

## 2019-01-30 DIAGNOSIS — T148XXA Other injury of unspecified body region, initial encounter: Secondary | ICD-10-CM

## 2019-01-30 DIAGNOSIS — S32592A Other specified fracture of left pubis, initial encounter for closed fracture: Secondary | ICD-10-CM | POA: Insufficient documentation

## 2019-01-30 DIAGNOSIS — Z1159 Encounter for screening for other viral diseases: Secondary | ICD-10-CM | POA: Diagnosis not present

## 2019-01-30 DIAGNOSIS — S32811A Multiple fractures of pelvis with unstable disruption of pelvic ring, initial encounter for closed fracture: Secondary | ICD-10-CM

## 2019-01-30 DIAGNOSIS — S32591A Other specified fracture of right pubis, initial encounter for closed fracture: Secondary | ICD-10-CM | POA: Diagnosis not present

## 2019-01-30 DIAGNOSIS — S3282XA Multiple fractures of pelvis without disruption of pelvic ring, initial encounter for closed fracture: Secondary | ICD-10-CM

## 2019-01-30 DIAGNOSIS — J939 Pneumothorax, unspecified: Secondary | ICD-10-CM

## 2019-01-30 DIAGNOSIS — S270XXA Traumatic pneumothorax, initial encounter: Principal | ICD-10-CM | POA: Insufficient documentation

## 2019-01-30 DIAGNOSIS — D62 Acute posthemorrhagic anemia: Secondary | ICD-10-CM | POA: Diagnosis not present

## 2019-01-30 DIAGNOSIS — Y92410 Unspecified street and highway as the place of occurrence of the external cause: Secondary | ICD-10-CM | POA: Insufficient documentation

## 2019-01-30 DIAGNOSIS — S2232XA Fracture of one rib, left side, initial encounter for closed fracture: Secondary | ICD-10-CM | POA: Diagnosis not present

## 2019-01-30 DIAGNOSIS — S3219XA Other fracture of sacrum, initial encounter for closed fracture: Secondary | ICD-10-CM | POA: Diagnosis not present

## 2019-01-30 DIAGNOSIS — S51012A Laceration without foreign body of left elbow, initial encounter: Secondary | ICD-10-CM | POA: Diagnosis not present

## 2019-01-30 DIAGNOSIS — E876 Hypokalemia: Secondary | ICD-10-CM | POA: Diagnosis not present

## 2019-01-30 DIAGNOSIS — S27321A Contusion of lung, unilateral, initial encounter: Secondary | ICD-10-CM | POA: Insufficient documentation

## 2019-01-30 LAB — COMPREHENSIVE METABOLIC PANEL
ALT: 18 U/L (ref 0–44)
AST: 35 U/L (ref 15–41)
Albumin: 4.1 g/dL (ref 3.5–5.0)
Alkaline Phosphatase: 62 U/L (ref 38–126)
Anion gap: 11 (ref 5–15)
BUN: 9 mg/dL (ref 6–20)
CO2: 21 mmol/L — ABNORMAL LOW (ref 22–32)
Calcium: 9.3 mg/dL (ref 8.9–10.3)
Chloride: 105 mmol/L (ref 98–111)
Creatinine, Ser: 0.94 mg/dL (ref 0.44–1.00)
GFR calc Af Amer: >60 mL/min (ref >60)
GFR calc non Af Amer: >60 mL/min (ref >60)
Glucose, Bld: 123 mg/dL — ABNORMAL HIGH (ref 70–99)
Potassium: 4.1 mmol/L (ref 3.5–5.1)
Sodium: 137 mmol/L (ref 135–145)
Total Bilirubin: 0.9 mg/dL (ref 0.3–1.2)
Total Protein: 7.5 g/dL (ref 6.5–8.1)

## 2019-01-30 LAB — CBC WITH DIFFERENTIAL/PLATELET
Abs Immature Granulocytes: 0.18 10*3/uL — ABNORMAL HIGH (ref 0.00–0.07)
Basophils Absolute: 0.1 10*3/uL (ref 0.0–0.1)
Basophils Relative: 0 %
Eosinophils Absolute: 0.1 10*3/uL (ref 0.0–0.5)
Eosinophils Relative: 0 %
HCT: 41.6 % (ref 36.0–46.0)
Hemoglobin: 13.2 g/dL (ref 12.0–15.0)
Immature Granulocytes: 2 %
Lymphocytes Relative: 14 %
Lymphs Abs: 1.5 10*3/uL (ref 0.7–4.0)
MCH: 28.5 pg (ref 26.0–34.0)
MCHC: 31.7 g/dL (ref 30.0–36.0)
MCV: 89.8 fL (ref 80.0–100.0)
Monocytes Absolute: 0.4 10*3/uL (ref 0.1–1.0)
Monocytes Relative: 4 %
Neutro Abs: 9.1 10*3/uL — ABNORMAL HIGH (ref 1.7–7.7)
Neutrophils Relative %: 80 %
Platelets: 306 10*3/uL (ref 150–400)
RBC: 4.63 MIL/uL (ref 3.87–5.11)
RDW: 13.9 % (ref 11.5–15.5)
WBC: 11.2 10*3/uL — ABNORMAL HIGH (ref 4.0–10.5)
nRBC: 0 % (ref 0.0–0.2)

## 2019-01-30 LAB — LACTIC ACID, PLASMA: Lactic Acid, Venous: 1.8 mmol/L (ref 0.5–1.9)

## 2019-01-30 LAB — I-STAT BETA HCG BLOOD, ED (MC, WL, AP ONLY): I-stat hCG, quantitative: <5 m[IU]/mL (ref <5)

## 2019-01-30 MED ORDER — PANTOPRAZOLE SODIUM 40 MG IV SOLR: 40.0000 mg | Freq: Every day | INTRAVENOUS | Status: DC

## 2019-01-30 MED ORDER — METHOCARBAMOL 500 MG PO TABS
500.0000 mg | ORAL_TABLET | Freq: Three times a day (TID) | ORAL | Status: DC
  Administered 2019-01-30 – 2019-01-31 (×5): 500 mg via ORAL
  Filled 2019-01-30 (×5): qty 1

## 2019-01-30 MED ORDER — DOCUSATE SODIUM 100 MG PO CAPS
100.0000 mg | ORAL_CAPSULE | Freq: Two times a day (BID) | ORAL | Status: DC
  Administered 2019-01-30 – 2019-02-01 (×4): 100 mg via ORAL
  Filled 2019-01-30 (×4): qty 1

## 2019-01-30 MED ORDER — ENOXAPARIN SODIUM 40 MG/0.4ML ~~LOC~~ SOLN
40.0000 mg | SUBCUTANEOUS | Status: DC
  Administered 2019-01-30 – 2019-01-31 (×2): 40 mg via SUBCUTANEOUS
  Filled 2019-01-30 (×2): qty 0.4

## 2019-01-30 MED ORDER — PANTOPRAZOLE SODIUM 40 MG PO TBEC
40.0000 mg | DELAYED_RELEASE_TABLET | Freq: Every day | ORAL | Status: DC
  Administered 2019-01-31 – 2019-02-01 (×2): 40 mg via ORAL
  Filled 2019-01-30 (×2): qty 1

## 2019-01-30 MED ORDER — HYDROMORPHONE HCL 1 MG/ML IJ SOLN
0.5000 mg | Freq: Once | INTRAMUSCULAR | Status: AC
  Administered 2019-01-30: 0.5 mg via INTRAVENOUS
  Filled 2019-01-30: qty 1

## 2019-01-30 MED ORDER — POLYETHYLENE GLYCOL 3350 17 G PO PACK
17.0000 g | PACK | Freq: Every day | ORAL | Status: DC | PRN
  Administered 2019-01-31: 17 g via ORAL
  Filled 2019-01-30: qty 1

## 2019-01-30 MED ORDER — OXYCODONE HCL 5 MG PO TABS
5.0000 mg | ORAL_TABLET | ORAL | Status: DC | PRN
  Administered 2019-01-30: 23:00:00 10 mg via ORAL
  Filled 2019-01-30: qty 2

## 2019-01-30 MED ORDER — IOPAMIDOL (ISOVUE-300) INJECTION 61%
100.0000 mL | Freq: Once | INTRAVENOUS | Status: AC | PRN
  Administered 2019-01-30: 100 mL via INTRAVENOUS

## 2019-01-30 MED ORDER — HYDRALAZINE HCL 20 MG/ML IJ SOLN: 10.0000 mg | INTRAMUSCULAR | Status: DC | PRN

## 2019-01-30 MED ORDER — HYDROMORPHONE HCL 1 MG/ML IJ SOLN
1.0000 mg | INTRAMUSCULAR | Status: DC | PRN
  Administered 2019-01-30: 1 mg via INTRAVENOUS
  Filled 2019-01-30: qty 1

## 2019-01-30 MED ORDER — IBUPROFEN 200 MG PO TABS
400.0000 mg | ORAL_TABLET | ORAL | Status: DC | PRN
  Administered 2019-01-31 (×3): 400 mg via ORAL
  Filled 2019-01-30 (×3): qty 2

## 2019-01-30 MED ORDER — ONDANSETRON HCL 4 MG/2ML IJ SOLN: 4.0000 mg | Freq: Four times a day (QID) | INTRAMUSCULAR | Status: DC | PRN

## 2019-01-30 MED ORDER — SODIUM CHLORIDE 0.45 % IV SOLN
INTRAVENOUS | Status: DC
  Administered 2019-01-30: 19:00:00 via INTRAVENOUS

## 2019-01-30 MED ORDER — ACETAMINOPHEN 500 MG PO TABS
1000.0000 mg | ORAL_TABLET | Freq: Three times a day (TID) | ORAL | Status: DC
  Administered 2019-01-30 – 2019-02-01 (×5): 1000 mg via ORAL
  Filled 2019-01-30 (×5): qty 2

## 2019-01-30 MED ORDER — MORPHINE SULFATE (PF) 4 MG/ML IV SOLN
4.0000 mg | Freq: Once | INTRAVENOUS | Status: AC
  Administered 2019-01-30: 09:00:00 4 mg via INTRAVENOUS
  Filled 2019-01-30: qty 1

## 2019-01-30 MED ORDER — ONDANSETRON 4 MG PO TBDP: 4.0000 mg | ORAL_TABLET | Freq: Four times a day (QID) | ORAL | Status: DC | PRN

## 2019-01-30 NOTE — ED Notes (Signed)
Patient transported to CT 
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
## 2019-01-30 NOTE — ED Notes (Signed)
Ordered patient regular tray from cafeteria. Ok'd by Silvestre Gunner, PA-C.
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
## 2019-01-31 ENCOUNTER — Observation Stay (HOSPITAL_COMMUNITY): Payer: 59

## 2019-01-31 ENCOUNTER — Encounter (HOSPITAL_COMMUNITY)
Admission: EM | Disposition: A | Discharge status: Home / Self Care | Payer: Self-pay | Source: Home / Self Care | Attending: Emergency Medicine

## 2019-01-31 ENCOUNTER — Encounter (HOSPITAL_COMMUNITY): Payer: Self-pay | Admitting: Anesthesiology

## 2019-01-31 DIAGNOSIS — S270XXA Traumatic pneumothorax, initial encounter: Secondary | ICD-10-CM | POA: Diagnosis not present

## 2019-01-31 LAB — NOVEL CORONAVIRUS, NAA (HOSP ORDER, SEND-OUT TO REF LAB; TAT 18-24 HRS): SARS-CoV-2, NAA: NOT DETECTED

## 2019-01-31 LAB — BASIC METABOLIC PANEL
Anion gap: 9 (ref 5–15)
BUN: 8 mg/dL (ref 6–20)
CO2: 22 mmol/L (ref 22–32)
Calcium: 8.6 mg/dL — ABNORMAL LOW (ref 8.9–10.3)
Chloride: 102 mmol/L (ref 98–111)
Creatinine, Ser: 0.75 mg/dL (ref 0.44–1.00)
GFR calc Af Amer: >60 mL/min (ref >60)
GFR calc non Af Amer: >60 mL/min (ref >60)
Glucose, Bld: 113 mg/dL — ABNORMAL HIGH (ref 70–99)
Potassium: 3.1 mmol/L — ABNORMAL LOW (ref 3.5–5.1)
Sodium: 133 mmol/L — ABNORMAL LOW (ref 135–145)

## 2019-01-31 LAB — CBC
HCT: 32.7 % — ABNORMAL LOW (ref 36.0–46.0)
Hemoglobin: 10.7 g/dL — ABNORMAL LOW (ref 12.0–15.0)
MCH: 28.8 pg (ref 26.0–34.0)
MCHC: 32.7 g/dL (ref 30.0–36.0)
MCV: 87.9 fL (ref 80.0–100.0)
Platelets: 237 10*3/uL (ref 150–400)
RBC: 3.72 MIL/uL — ABNORMAL LOW (ref 3.87–5.11)
RDW: 14 % (ref 11.5–15.5)
WBC: 7.1 10*3/uL (ref 4.0–10.5)
nRBC: 0 % (ref 0.0–0.2)

## 2019-01-31 LAB — SARS CORONAVIRUS 2: SARS Coronavirus 2: NOT DETECTED

## 2019-01-31 SURGERY — CLOSED REDUCTION, PELVIS, WITH PERCUTANEOUS FIXATION
Anesthesia: General | Laterality: Left

## 2019-01-31 MED ORDER — POTASSIUM CHLORIDE CRYS ER 20 MEQ PO TBCR
20.0000 meq | EXTENDED_RELEASE_TABLET | Freq: Two times a day (BID) | ORAL | Status: DC
  Administered 2019-01-31 – 2019-02-01 (×3): 20 meq via ORAL
  Filled 2019-01-31 (×3): qty 1

## 2019-01-31 NOTE — Progress Notes (Signed)
OT Cancellation Note  Patient Details Name: Jessica Villarreal MRN: [id number] DOB: [birth year]   Cancelled Treatment:    Reason Eval/Treat Not Completed: Active bedrest order; per ortho consult note "Plan transsacral screw fixation on Wednesday or Thursday. Bedrest for now."  Lou Cal, OT [facility] Pager [phone] Office [phone]   Raymondo Band 01/31/2019, 8:11 AM
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
## 2019-01-31 NOTE — Progress Notes (Signed)
Ortho Trauma Progress Note  Patient seen and examined.  Sitting up in bed not having much pain.  No significant pain with lateral compression of the pelvis.  We will plan for nonoperative treatment.  Touchdown weightbearing left lower extremity weightbearing as tolerated right lower extremity.  We will get post mobilization films either later today or tomorrow.  We will reevaluate her later today after mobilization with therapy to see if we need to proceed with surgery.  No plans for surgical intervention at this time.  Shona Needles, MD [facility] [phone] (phone) [phone] (office) [email or web address]

## 2019-01-31 NOTE — Progress Notes (Signed)
PT Cancellation Note  Patient Details Name: [name] MRN: [id number] DOB: [birth year]   Cancelled Treatment:    Reason Eval/Treat Not Completed: Patient not medically ready. Per ortho PA, "Plan transsacral screw fixation by Dr. Marcelino Scot or [place], either Wednesday or Thursday. Bedrest for now."  [name], PT, DPT Acute Rehabilitation Services Pager [phone] Office [phone]    [name] 01/31/2019, 8:09 AM

## 2019-01-31 NOTE — Plan of Care (Signed)

## 2019-02-01 ENCOUNTER — Observation Stay (HOSPITAL_COMMUNITY): Payer: 59

## 2019-02-01 DIAGNOSIS — S270XXA Traumatic pneumothorax, initial encounter: Secondary | ICD-10-CM | POA: Diagnosis not present

## 2019-02-01 LAB — BASIC METABOLIC PANEL
Anion gap: 9 (ref 5–15)
BUN: 5 mg/dL — ABNORMAL LOW (ref 6–20)
CO2: 23 mmol/L (ref 22–32)
Calcium: 8.7 mg/dL — ABNORMAL LOW (ref 8.9–10.3)
Chloride: 106 mmol/L (ref 98–111)
Creatinine, Ser: 0.75 mg/dL (ref 0.44–1.00)
GFR calc Af Amer: >60 mL/min (ref >60)
GFR calc non Af Amer: >60 mL/min (ref >60)
Glucose, Bld: 99 mg/dL (ref 70–99)
Potassium: 4.4 mmol/L (ref 3.5–5.1)
Sodium: 138 mmol/L (ref 135–145)

## 2019-02-01 LAB — CBC
HCT: 32.2 % — ABNORMAL LOW (ref 36.0–46.0)
Hemoglobin: 10.3 g/dL — ABNORMAL LOW (ref 12.0–15.0)
MCH: 28.5 pg (ref 26.0–34.0)
MCHC: 32 g/dL (ref 30.0–36.0)
MCV: 89 fL (ref 80.0–100.0)
Platelets: 195 10*3/uL (ref 150–400)
RBC: 3.62 MIL/uL — ABNORMAL LOW (ref 3.87–5.11)
RDW: 14.2 % (ref 11.5–15.5)
WBC: 6.2 10*3/uL (ref 4.0–10.5)
nRBC: 0 % (ref 0.0–0.2)

## 2019-02-01 MED ORDER — OXYCODONE HCL 5 MG PO TABS: 5.0000 mg | ORAL_TABLET | ORAL | 0 refills | Status: DC | PRN

## 2019-02-01 MED ORDER — METHOCARBAMOL 750 MG PO TABS
750.0000 mg | ORAL_TABLET | Freq: Three times a day (TID) | ORAL | Status: DC
  Administered 2019-02-01: 750 mg via ORAL
  Filled 2019-02-01: qty 1

## 2019-02-01 MED ORDER — ACETAMINOPHEN 500 MG PO TABS: 1000.0000 mg | ORAL_TABLET | Freq: Three times a day (TID) | ORAL | 0 refills | Status: DC

## 2019-02-01 MED ORDER — DOCUSATE SODIUM 100 MG PO CAPS: 100.0000 mg | ORAL_CAPSULE | Freq: Two times a day (BID) | ORAL | 0 refills | Status: DC | PRN

## 2019-02-01 MED ORDER — METHOCARBAMOL 750 MG PO TABS: 750.0000 mg | ORAL_TABLET | Freq: Three times a day (TID) | ORAL | 0 refills | Status: DC

## 2019-02-01 NOTE — Progress Notes (Signed)
Ortho trauma note:  X-rays stable.  Patient continues to have minimal pain.  She is able to work well with therapy.  Continue to plan on nonoperative treatment.  Recommend follow-up in the office in 2 to 3 weeks for repeat x-rays and likely advancement of weightbearing on the left lower extremity.  Shona Needles, MD [facility] [phone] (phone) [phone] (office) [email or web address]

## 2019-02-01 NOTE — TOC Transition Note (Signed)
Transition of Care [facility]) - CM/SW Discharge Note   Patient Details  Name: Jessica Villarreal MRN: [id number] Date of Birth: June 30, 1986  Transition of Care [facility]) CM/SW Contact:  Alexander Mt, [place] Phone Number: 02/01/2019, 2:59 PM   Clinical Narrative:    3-in-1 and walker ordered through [employer] to be delivered to room, PCP appointment scheduled and they will f/u with pt sister [name] with her permission at [phone]. CSW called phone in room and discussed the information above. Pt aware of challenges with finding [facility] under [place]. CSW will continue to search, CSW AD aware and is f/u with [facility] rep in order to assist with services. CSW will f/u with pt sisters with any [facility] agency that is able to accept at this time.    Final next level of care: [employer] Barriers to Discharge: Equipment Delay   Patient Goals and CMS Choice Patient states their goals for this hospitalization and ongoing recovery are:: to get better-go home CMS Medicare.gov Compare Post Acute Care list provided to:: Patient Choice offered to / list presented to : Patient  Discharge Placement   Discharge Plan and Services In-house Referral: Clinical Social Work Discharge Planning Services: Follow-up appt scheduled, CM Consult Post Acute Care Choice: Durable Medical Equipment, Home Health          DME Arranged: Walker rolling, 3-N-1 DME Agency: [employer] Date DME Agency Contacted: 02/01/19 Time DME Agency Contacted: [phone] Representative spoke with at DME Agency: [employer] Arranged: OT, PT          Social Determinants of Health (SDOH) Interventions     Readmission Risk Interventions No flowsheet data found.

## 2019-02-01 NOTE — Progress Notes (Signed)
Pt with d/c orders. IV removed. Discharge paperwork reviewed with pt and all questions answered. Pt with belongings at time of discharge and escorted to vehicle via wheelchair. Prescriptions electronically faxed to pharmacy.
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
## 2019-02-01 NOTE — Discharge Instructions (Signed)
Touchdown weightbearing left lower extremity weightbearing as tolerated right lower extremity    PNEUMOTHORAX OR HEMOTHORAX +/- RIB FRACTURES  HOME INSTRUCTIONS   1. PAIN CONTROL:  1. Pain is best controlled by a usual combination of three different methods TOGETHER:  i. Ice/Heat ii. Over the counter pain medication iii. Prescription pain medication 2. You may experience some swelling and bruising in area of broken ribs. Ice packs or heating pads (30-60 minutes up to 6 times a day) will help. Use ice for the first few days to help decrease swelling and bruising, then switch to heat to help relax tight/sore spots and speed recovery. Some people prefer to use ice alone, heat alone, alternating between ice & heat. Experiment to what works for you. Swelling and bruising can take several weeks to resolve.  3. It is helpful to take an over-the-counter pain medication regularly for the first few weeks. Choose one of the following that works best for you:  i. Naproxen (Aleve, etc) Two 220mg  tabs twice a day ii. Ibuprofen (Advil, etc) Three 200mg  tabs four times a day (every meal & bedtime) iii. Acetaminophen (Tylenol, etc) 500-650mg  four times a day (every meal & bedtime) 4. A prescription for pain medication (such as oxycodone, hydrocodone, etc) may be given to you upon discharge. Take your pain medication as prescribed.  i. If you are having problems/concerns with the prescription medicine (does not control pain, nausea, vomiting, rash, itching, etc), please call us [phone] to see if we need to switch you to a different pain medicine that will work better for you and/or control your side effect better. ii. If you need a refill on your pain medication, please contact your pharmacy. They will contact our office to request authorization. Prescriptions will not be filled after 5 pm or on week-ends. 1. Avoid getting constipated. When taking pain medications, it is common to experience some  constipation. Increasing fluid intake and taking a fiber supplement (such as Metamucil, Citrucel, FiberCon, MiraLax, etc) 1-2 times a day regularly will usually help prevent this problem from occurring. A mild laxative (prune juice, Milk of Magnesia, MiraLax, etc) should be taken according to package directions if there are no bowel movements after 48 hours.  2. Watch out for diarrhea. If you have many loose bowel movements, simplify your diet to bland foods & liquids for a few days. Stop any stool softeners and decrease your fiber supplement. Switching to mild anti-diarrheal medications (Kayopectate, Pepto Bismol) can help. If this worsens or does not improve, please call us. 3. Chest tube site wound: you may remove the dressing from your chest tube site 3 days after the removal of your chest tube. DO NOT shower over the dressing. Once   removed, you may shower as normal. Do not submerge your wound in water for 2-3 weeks.  4. FOLLOW UP  a. Please call our office to set up or confirm an appointment for follow up for 2 weeks after discharge. You will need to get a chest xray at either [facility] or [facility]. This will be outlined in your follow up instructions. Please call [facility] at [phone] if you have any questions about follow up.  b. If you have any orthopedic or other injuries you will need to follow up as outlined in your follow up instructions.   WHEN TO CALL us [phone]:  1. Poor pain control 2. Reactions / problems with new medications (rash/itching, nausea, etc)  3. Fever over 101.5 F (38.5  C) 4. Worsening swelling or bruising 5. Redness, drainage, pain or swelling around chest tube site 6. Worsening pain, productive cough, difficulty breathing or any other concerning symptoms  The clinic staff is available to answer your questions during regular business hours (8:30am-5pm). Please dont hesitate to call and ask to speak to one of our nurses for clinical concerns.  If  you have a medical emergency, go to the nearest emergency room or call 911.  A surgeon from [facility] is always on call at the [facility], [place], [place], [place], [place] [address] ?  MAIN: [phone] ? TOLL FREE: [phone] ?  FAX [phone]  [email or web address]      Information on Rib Fractures  A rib fracture is a break or crack in one of the bones of the ribs. The ribs are long, curved bones that wrap around your chest and attach to your spine and your breastbone. The ribs protect your heart, lungs, and other organs in the chest. A broken or cracked rib is often painful but is not usually serious. Most rib fractures heal on their own over time. However, rib fractures can be more serious if multiple ribs are broken or if broken ribs move out of place and push against other structures or organs. What are the causes? This condition is caused by:  Repetitive movements with high force, such as pitching a baseball or having severe coughing spells.  A direct blow to the chest, such as a sports injury, a car accident, or a fall.  Cancer that has spread to the bones, which can weaken bones and cause them to break. What are the signs or symptoms? Symptoms of this condition include:  Pain when you breathe in or cough.  Pain when someone presses on the injured area.  Feeling short of breath. How is this diagnosed? This condition is diagnosed with a physical exam and medical history. Imaging tests may also be done, such as:  Chest X-ray.  CT scan.  MRI.  Bone scan.  Chest ultrasound. How is this treated? Treatment for this condition depends on the severity of the fracture. Most rib fractures usually heal on their own in 1-3 months. Sometimes healing takes longer if there is a cough that does not stop or if there are other activities that make the injury worse (aggravating factors). While you heal,  you will be given medicines to control the pain. You will also be taught deep breathing exercises. Severe injuries may require hospitalization or surgery. Follow these instructions at home: Managing pain, stiffness, and swelling  If directed, apply ice to the injured area. ? Put ice in a plastic bag. ? Place a towel between your skin and the bag. ? Leave the ice on for 20 minutes, 2-3 times a day.  Take over-the-counter and prescription medicines only as told by your health care provider. Activity  Avoid a lot of activity and any activities or movements that cause pain. Be careful during activities and avoid bumping the injured rib.  Slowly increase your activity as told by your health care provider. General instructions  Do deep breathing exercises as told by your health care provider. This helps prevent pneumonia, which is a common complication of a broken rib. Your health care provider may instruct you to: ? Take deep breaths several times a day. ? Try to cough several times a day, holding a pillow against the injured area. ? Use a device  called incentive spirometer to practice deep breathing several times a day.  Drink enough fluid to keep your urine pale yellow.  Do not wear a rib belt or binder. These restrict breathing, which can lead to pneumonia.  Keep all follow-up visits as told by your health care provider. This is important. Contact a health care provider if:  You have a fever. Get help right away if:  You have difficulty breathing or you are short of breath.  You develop a cough that does not stop, or you cough up thick or bloody sputum.  You have nausea, vomiting, or pain in your abdomen.  Your pain gets worse and medicine does not help. Summary  A rib fracture is a break or crack in one of the bones of the ribs.  A broken or cracked rib is often painful but is not usually serious.  Most rib fractures heal on their own over time.  Treatment for this  condition depends on the severity of the fracture.  Avoid a lot of activity and any activities or movements that cause pain. This information is not intended to replace advice given to you by your health care provider. Make sure you discuss any questions you have with your health care provider. Document Released: 08/02/2005 Document Revised: 11/01/2016 Document Reviewed: 11/01/2016 Elsevier Interactive Patient Education  2019 [employer]    Pneumothorax A pneumothorax is commonly called a collapsed lung. It is a condition in which air leaks from a lung and builds up between the thin layer of tissue that covers the lungs (visceral pleura) and the interior wall of the chest cavity (parietal pleura). The air gets trapped outside the lung, between the lung and the chest wall (pleural space). The air takes up space and prevents the lung from fully expanding. This condition sometimes occurs suddenly with no apparent cause. The buildup of air may be small or large. A small pneumothorax may go away on its own. A large pneumothorax will require treatment and hospitalization. What are the causes? This condition may be caused by:  Trauma and injury to the chest wall.  Surgery and other medical procedures.  A complication of an underlying lung problem, especially chronic obstructive pulmonary disease (COPD) or emphysema. Sometimes the cause of this condition is not known. What increases the risk? You are more likely to develop this condition if:  You have an underlying lung problem.  You smoke.  You are 21-48 years old, female, tall, and underweight.  You have a personal or family history of pneumothorax.  You have an eating disorder (anorexia nervosa). This condition can also happen quickly, even in people with no history of lung problems. What are the signs or symptoms? Sometimes a pneumothorax will have no symptoms. When symptoms are present, they can include:  Chest pain.  Shortness  of breath.  Increased rate of breathing.  Bluish color to your lips or skin (cyanosis). How is this diagnosed? This condition may be diagnosed by:  A medical history and physical exam.  A chest X-ray, chest CT scan, or ultrasound. How is this treated? Treatment depends on how severe your condition is. The goal of treatment is to remove the extra air and allow your lung to expand back to its normal size.  For a small pneumothorax: ? No treatment may be needed. ? Extra oxygen is sometimes used to make it go away more quickly.  For a large pneumothorax or a pneumothorax that is causing symptoms, a procedure is done to  drain the air from your lungs. To do this, a health care provider may use: ? A needle with a syringe. This is used to suck air from a pleural space where no additional leakage is taking place. ? A chest tube. This is used to suck air where there is ongoing leakage into the pleural space. The chest tube may need to remain in place for several days until the air leak has healed.  In more severe cases, surgery may be needed to repair the damage that is causing the leak.  If you have multiple pneumothorax episodes or have an air leak that will not heal, a procedure called a pleurodesis may be done. A medicine is placed in the pleural space to irritate the tissues around the lung so that the lung will stick to the chest wall, seal any leaks, and stop any buildup of air in that space. If you have an underlying lung problem, severe symptoms, or a large pneumothorax you will usually need to stay in the hospital. Follow these instructions at home: Lifestyle  Do not use any products that contain nicotine or tobacco, such as cigarettes and e-cigarettes. These are major risk factors in pneumothorax. If you need help quitting, ask your health care provider.  Do not lift anything that is heavier than 10 lb (4.5 kg), or the limit that your health care provider tells you, until he or she  says that it is safe.  Avoid activities that take a lot of effort (strenuous) for as long as told by your health care provider.  Return to your normal activities as told by your health care provider. Ask your health care provider what activities are safe for you.  Do not fly in an airplane or scuba dive until your health care provider says it is okay. General instructions  Take over-the-counter and prescription medicines only as told by your health care provider.  If a cough or pain makes it difficult for you to sleep at night, try sleeping in a semi-upright position in a recliner or by using 2 or 3 pillows.  If you had a chest tube and it was removed, ask your health care provider when you can remove the bandage (dressing). While the dressing is in place, do not allow it to get wet.  Keep all follow-up visits as told by your health care provider. This is important. Contact a health care provider if:  You cough up thick mucus (sputum) that is yellow or green in color.  You were treated with a chest tube, and you have redness, increasing pain, or discharge at the site where it was placed. Get help right away if:  You have increasing chest pain or shortness of breath.  You have a cough that will not go away.  You begin coughing up blood.  You have pain that is getting worse or is not controlled with medicines.  The site where your chest tube was located opens up.  You feel air coming out of the site where the chest tube was placed.  You have a fever or persistent symptoms for more than 2-3 days.  You have a fever and your symptoms suddenly get worse. These symptoms may represent a serious problem that is an emergency. Do not wait to see if the symptoms will go away. Get medical help right away. Call your local emergency services (911 in the [place] Do not drive yourself to the hospital. Summary  A pneumothorax, commonly called a collapsed lung, is  a condition in which air leaks  from a lung and gets trapped between the lung and the chest wall (pleural space).  The buildup of air may be small or large. A small pneumothorax may go away on its own. A large pneumothorax will require treatment and hospitalization.  Treatment for this condition depends on how severe the pneumothorax is. The goal of treatment is to remove the extra air and allow the lung to expand back to its normal size. This information is not intended to replace advice given to you by your health care provider. Make sure you discuss any questions you have with your health care provider. Document Released: 08/02/2005 Document Revised: 07/11/2017 Document Reviewed: 07/11/2017 Elsevier Interactive Patient Education  2019 [employer].
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
## 2019-02-05 ENCOUNTER — Ambulatory Visit: Payer: Self-pay | Admitting: *Deleted

## 2019-02-05 ENCOUNTER — Ambulatory Visit (INDEPENDENT_AMBULATORY_CARE_PROVIDER_SITE_OTHER): Payer: 59 | Admitting: Family Medicine

## 2019-02-05 ENCOUNTER — Ambulatory Visit: Payer: Self-pay

## 2019-02-05 ENCOUNTER — Encounter: Payer: Self-pay | Admitting: Family Medicine

## 2019-02-05 ENCOUNTER — Other Ambulatory Visit: Payer: Self-pay

## 2019-02-05 DIAGNOSIS — S32810D Multiple fractures of pelvis with stable disruption of pelvic ring, subsequent encounter for fracture with routine healing: Secondary | ICD-10-CM

## 2019-02-05 DIAGNOSIS — S2232XD Fracture of one rib, left side, subsequent encounter for fracture with routine healing: Secondary | ICD-10-CM | POA: Diagnosis not present

## 2019-02-05 DIAGNOSIS — S270XXD Traumatic pneumothorax, subsequent encounter: Secondary | ICD-10-CM
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
## 2019-02-05 NOTE — Telephone Encounter (Signed)
  Reason for Disposition . Caller has medication question only, adult not sick, and triager answers question  Protocols used: MEDICATION QUESTION CALL-A-AH  

## 2019-02-06 ENCOUNTER — Telehealth: Payer: Self-pay

## 2019-02-06 ENCOUNTER — Ambulatory Visit
Admission: EM | Admit: 2019-02-06 | Discharge: 2019-02-06 | Disposition: A | Discharge status: Home / Self Care | Payer: 59 | Attending: Physician Assistant | Admitting: Physician Assistant

## 2019-02-06 ENCOUNTER — Ambulatory Visit (INDEPENDENT_AMBULATORY_CARE_PROVIDER_SITE_OTHER): Payer: 59

## 2019-02-06 ENCOUNTER — Other Ambulatory Visit: Payer: Self-pay

## 2019-02-06 DIAGNOSIS — R509 Fever, unspecified: Secondary | ICD-10-CM

## 2019-02-06 MED ORDER — FLUTICASONE PROPIONATE 50 MCG/ACT NA SUSP: 2.0000 | Freq: Every day | NASAL | 0 refills | Status: DC

## 2019-02-06 MED ORDER — ACETAMINOPHEN 325 MG PO TABS: 975.0000 mg | ORAL_TABLET | Freq: Once | ORAL | Status: DC
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
## 2019-02-06 NOTE — Telephone Encounter (Signed)
Copied from [place] [phone] [id number]. Topic: Medical Record Request - Provider/Facility Request >> Feb 06, 2019  9:33 AM Erick Blinks wrote: Patient Name/DOB/MRN #: Jessica Villarreal  1986/06/23  [id number] Requestor Name/Agency: [facility], [place]  Call Back #: [phone] Information Requested: Call back from Nurse - contacted by pt's insurance to start home health. Needs order from PCP. Home Health PT and OT    Route to [place] for [place] clinics. For all other clinics, route to the clinic's PEC Pool.

## 2019-02-06 NOTE — Discharge Instructions (Signed)
No alarming signs on exam. Chest xray showed improved collapsed lung. No signs of pneumonia. As discussed, although with slightly elevated temperature, not concerning for fever. Continue tylenol as needed and monitor symptoms. If develop temperature over 100.5, new cough, worsening shortness of breath, follow up for reevaluation. If develop sudden worsening of shortness of breath, one sided leg swelling, go to the ED for further evaluation needed.

## 2019-02-07 NOTE — Telephone Encounter (Signed)
Ok for orders? 

## 2019-02-08 NOTE — Telephone Encounter (Signed)
Please advise 

## 2019-02-08 NOTE — Telephone Encounter (Signed)
ok 

## 2019-02-09 ENCOUNTER — Other Ambulatory Visit: Payer: Self-pay

## 2019-02-09 NOTE — Telephone Encounter (Signed)
Jessica Villarreal with [facility]  LVM with detailed message regarding approved orders for PT/OT, Advised to call office for further information.

## 2019-02-09 NOTE — Telephone Encounter (Signed)
That's fine

## 2019-02-13 ENCOUNTER — Encounter: Payer: Self-pay | Admitting: Student

## 2019-02-13 DIAGNOSIS — S329XXA Fracture of unspecified parts of lumbosacral spine and pelvis, initial encounter for closed fracture: Secondary | ICD-10-CM | POA: Insufficient documentation

## 2019-02-13 NOTE — Telephone Encounter (Signed)
Spoke with pt verbalized understanding that Dr Volanda Napoleon approves pt of taking baby Aspirin

## 2019-02-19 NOTE — Telephone Encounter (Signed)
Called Jessica Villarreal with [facility] left a message regarding approved verbal orders for pt for Home health/PT/OT

## 2019-06-06 DIAGNOSIS — F4323 Adjustment disorder with mixed anxiety and depressed mood: Secondary | ICD-10-CM | POA: Diagnosis not present

## 2019-07-11 ENCOUNTER — Other Ambulatory Visit: Payer: Self-pay

## 2019-07-11 DIAGNOSIS — E559 Vitamin D deficiency, unspecified: Secondary | ICD-10-CM

## 2019-07-11 MED ORDER — VITAMIN D (ERGOCALCIFEROL) 1.25 MG (50000 UNIT) PO CAPS: 50000.0000 [IU] | ORAL_CAPSULE | ORAL | 0 refills | Status: DC

## 2019-07-21 ENCOUNTER — Other Ambulatory Visit: Payer: Self-pay

## 2019-07-21 DIAGNOSIS — Z20822 Contact with and (suspected) exposure to covid-19: Secondary | ICD-10-CM

## 2019-07-24 DIAGNOSIS — S329XXD Fracture of unspecified parts of lumbosacral spine and pelvis, subsequent encounter for fracture with routine healing: Secondary | ICD-10-CM | POA: Diagnosis not present

## 2019-07-24 LAB — NOVEL CORONAVIRUS, NAA: SARS-CoV-2, NAA: NOT DETECTED

## 2019-07-30 DIAGNOSIS — Z20828 Contact with and (suspected) exposure to other viral communicable diseases: Secondary | ICD-10-CM | POA: Diagnosis not present

## 2019-07-31 DIAGNOSIS — Z20828 Contact with and (suspected) exposure to other viral communicable diseases: Secondary | ICD-10-CM | POA: Diagnosis not present

## 2019-08-08 ENCOUNTER — Other Ambulatory Visit: Payer: Self-pay

## 2019-08-09 ENCOUNTER — Encounter: Payer: Self-pay | Admitting: Family Medicine

## 2019-08-09 ENCOUNTER — Ambulatory Visit (INDEPENDENT_AMBULATORY_CARE_PROVIDER_SITE_OTHER): Payer: BC Managed Care – PPO | Admitting: Family Medicine

## 2019-08-09 VITALS — BP 118/76 | HR 92 | Temp 97.8°F | Wt 145.0 lb

## 2019-08-09 DIAGNOSIS — Z113 Encounter for screening for infections with a predominantly sexual mode of transmission: Secondary | ICD-10-CM

## 2019-08-09 DIAGNOSIS — Z0000 Encounter for general adult medical examination without abnormal findings: Secondary | ICD-10-CM | POA: Diagnosis not present

## 2019-08-09 DIAGNOSIS — Z8781 Personal history of (healed) traumatic fracture: Secondary | ICD-10-CM

## 2019-08-09 DIAGNOSIS — Z13220 Encounter for screening for lipoid disorders: Secondary | ICD-10-CM | POA: Diagnosis not present

## 2019-08-09 DIAGNOSIS — Z131 Encounter for screening for diabetes mellitus: Secondary | ICD-10-CM

## 2019-08-09 DIAGNOSIS — F4323 Adjustment disorder with mixed anxiety and depressed mood: Secondary | ICD-10-CM | POA: Diagnosis not present

## 2019-08-09 LAB — CBC WITH DIFFERENTIAL/PLATELET
Basophils Absolute: 0 10*3/uL (ref 0.0–0.1)
Basophils Relative: 0.3 % (ref 0.0–3.0)
Eosinophils Absolute: 0.1 10*3/uL (ref 0.0–0.7)
Eosinophils Relative: 1.4 % (ref 0.0–5.0)
HCT: 39.1 % (ref 36.0–46.0)
Hemoglobin: 12.7 g/dL (ref 12.0–15.0)
Lymphocytes Relative: 21.8 % (ref 12.0–46.0)
Lymphs Abs: 1.3 10*3/uL (ref 0.7–4.0)
MCHC: 32.6 g/dL (ref 30.0–36.0)
MCV: 87.7 fl (ref 78.0–100.0)
Monocytes Absolute: 0.3 10*3/uL (ref 0.1–1.0)
Monocytes Relative: 5.4 % (ref 3.0–12.0)
Neutro Abs: 4.1 10*3/uL (ref 1.4–7.7)
Neutrophils Relative %: 71.1 % (ref 43.0–77.0)
Platelets: 324 10*3/uL (ref 150.0–400.0)
RBC: 4.45 Mil/uL (ref 3.87–5.11)
RDW: 15.5 % (ref 11.5–15.5)
WBC: 5.8 10*3/uL (ref 4.0–10.5)

## 2019-08-09 LAB — HEMOGLOBIN A1C: Hgb A1c MFr Bld: 5.3 % (ref 4.6–6.5)

## 2019-08-09 LAB — LIPID PANEL
Cholesterol: 179 mg/dL (ref 0–200)
HDL: 55.2 mg/dL (ref >39.00)
LDL Cholesterol: 116 mg/dL — ABNORMAL HIGH (ref 0–99)
NonHDL: 124.06
Total CHOL/HDL Ratio: 3
Triglycerides: 41 mg/dL (ref 0.0–149.0)
VLDL: 8.2 mg/dL (ref 0.0–40.0)

## 2019-08-09 LAB — T4, FREE: Free T4: 0.91 ng/dL (ref 0.60–1.60)

## 2019-08-09 LAB — TSH: TSH: 1.05 u[IU]/mL (ref 0.35–4.50)

## 2019-08-09 MED ORDER — FLUTICASONE PROPIONATE 50 MCG/ACT NA SUSP: 2.0000 | Freq: Every day | NASAL | 5 refills | Status: DC

## 2019-08-09 NOTE — Patient Instructions (Addendum)
Preventive Care 21-35 Years Old, Female Preventive care refers to visits with your health care provider and lifestyle choices that can promote health and wellness. This includes:  A yearly physical exam. This may also be called an annual well check.  Regular dental visits and eye exams.  Immunizations.  Screening for certain conditions.  Healthy lifestyle choices, such as eating a healthy diet, getting regular exercise, not using drugs or products that contain nicotine and tobacco, and limiting alcohol use. What can I expect for my preventive care visit? Physical exam Your health care provider will check your:  Height and weight. This may be used to calculate body mass index (BMI), which tells if you are at a healthy weight.  Heart rate and blood pressure.  Skin for abnormal spots. Counseling Your health care provider may ask you questions about your:  Alcohol, tobacco, and drug use.  Emotional well-being.  Home and relationship well-being.  Sexual activity.  Eating habits.  Work and work environment.  Method of birth control.  Menstrual cycle.  Pregnancy history. What immunizations do I need?  Influenza (flu) vaccine  This is recommended every year. Tetanus, diphtheria, and pertussis (Tdap) vaccine  You may need a Td booster every 10 years. Varicella (chickenpox) vaccine  You may need this if you have not been vaccinated. Human papillomavirus (HPV) vaccine  If recommended by your health care provider, you may need three doses over 6 months. Measles, mumps, and rubella (MMR) vaccine  You may need at least one dose of MMR. You may also need a second dose. Meningococcal conjugate (MenACWY) vaccine  One dose is recommended if you are age 19-21 years and a first-year college student living in a residence hall, or if you have one of several medical conditions. You may also need additional booster doses. Pneumococcal conjugate (PCV13) vaccine  You may need  this if you have certain conditions and were not previously vaccinated. Pneumococcal polysaccharide (PPSV23) vaccine  You may need one or two doses if you smoke cigarettes or if you have certain conditions. Hepatitis A vaccine  You may need this if you have certain conditions or if you travel or work in places where you may be exposed to hepatitis A. Hepatitis B vaccine  You may need this if you have certain conditions or if you travel or work in places where you may be exposed to hepatitis B. Haemophilus influenzae type b (Hib) vaccine  You may need this if you have certain conditions. You may receive vaccines as individual doses or as more than one vaccine together in one shot (combination vaccines). Talk with your health care provider about the risks and benefits of combination vaccines. What tests do I need?  Blood tests  Lipid and cholesterol levels. These may be checked every 5 years starting at age 20.  Hepatitis C test.  Hepatitis B test. Screening  Diabetes screening. This is done by checking your blood sugar (glucose) after you have not eaten for a while (fasting).  Sexually transmitted disease (STD) testing.  BRCA-related cancer screening. This may be done if you have a family history of breast, ovarian, tubal, or peritoneal cancers.  Pelvic exam and Pap test. This may be done every 3 years starting at age 21. Starting at age 30, this may be done every 5 years if you have a Pap test in combination with an HPV test. Talk with your health care provider about your test results, treatment options, and if necessary, the need for more tests.   Follow these instructions at home: Eating and drinking   Eat a diet that includes fresh fruits and vegetables, whole grains, lean protein, and low-fat dairy.  Take vitamin and mineral supplements as recommended by your health care provider.  Do not drink alcohol if: ? Your health care provider tells you not to drink. ? You are  pregnant, may be pregnant, or are planning to become pregnant.  If you drink alcohol: ? Limit how much you have to 0-1 drink a day. ? Be aware of how much alcohol is in your drink. In the [place] one drink equals one 12 oz bottle of beer (355 mL), one 5 oz glass of wine (148 mL), or one 1 oz glass of hard liquor (44 mL). Lifestyle  Take daily care of your teeth and gums.  Stay active. Exercise for at least 30 minutes on 5 or more days each week.  Do not use any products that contain nicotine or tobacco, such as cigarettes, e-cigarettes, and chewing tobacco. If you need help quitting, ask your health care provider.  If you are sexually active, practice safe sex. Use a condom or other form of birth control (contraception) in order to prevent pregnancy and STIs (sexually transmitted infections). If you plan to become pregnant, see your health care provider for a preconception visit. What's next?  Visit your health care provider once a year for a well check visit.  Ask your health care provider how often you should have your eyes and teeth checked.  Stay up to date on all vaccines. This information is not intended to replace advice given to you by your health care provider. Make sure you discuss any questions you have with your health care provider. Document Released: 09/28/2001 Document Revised: 04/13/2018 Document Reviewed: 04/13/2018 Elsevier Patient Education  2020 Else[employer] Inc.  Living With Anxiety  After being diagnosed with an anxiety disorder, you may be relieved to know why you have felt or behaved a certain way. It is natural to also feel overwhelmed about the treatment ahead and what it will mean for your life. With care and support, you can manage this condition and recover from it. How to cope with anxiety Dealing with stress Stress is your body's reaction to life changes and events, both good and bad. Stress can last just a few hours or it can be ongoing. Stress can play a  major role in anxiety, so it is important to learn both how to cope with stress and how to think about it differently. Talk with your health care provider or a counselor to learn more about stress reduction. He or she may suggest some stress reduction techniques, such as:  Music therapy. This can include creating or listening to music that you enjoy and that inspires you.  Mindfulness-based meditation. This involves being aware of your normal breaths, rather than trying to control your breathing. It can be done while sitting or walking.  Centering prayer. This is a kind of meditation that involves focusing on a word, phrase, or sacred image that is meaningful to you and that brings you peace.  Deep breathing. To do this, expand your stomach and inhale slowly through your nose. Hold your breath for 3-5 seconds. Then exhale slowly, allowing your stomach muscles to relax.  Self-talk. This is a skill where you identify thought patterns that lead to anxiety reactions and correct those thoughts.  Muscle relaxation. This involves tensing muscles then relaxing them. Choose a stress reduction technique that fits your lifestyle   and personality. Stress reduction techniques take time and practice. Set aside 5-15 minutes a day to do them. Therapists can offer training in these techniques. The training may be covered by some insurance plans. Other things you can do to manage stress include:  Keeping a stress diary. This can help you learn what triggers your stress and ways to control your response.  Thinking about how you respond to certain situations. You may not be able to control everything, but you can control your reaction.  Making time for activities that help you relax, and not feeling guilty about spending your time in this way. Therapy combined with coping and stress-reduction skills provides the best chance for successful treatment. Medicines Medicines can help ease symptoms. Medicines for anxiety  include:  Anti-anxiety drugs.  Antidepressants.  Beta-blockers. Medicines may be used as the main treatment for anxiety disorder, along with therapy, or if other treatments are not working. Medicines should be prescribed by a health care provider. Relationships Relationships can play a big part in helping you recover. Try to spend more time connecting with trusted friends and family members. Consider going to couples counseling, taking family education classes, or going to family therapy. Therapy can help you and others better understand the condition. How to recognize changes in your condition Everyone has a different response to treatment for anxiety. Recovery from anxiety happens when symptoms decrease and stop interfering with your daily activities at home or work. This may mean that you will start to:  Have better concentration and focus.  Sleep better.  Be less irritable.  Have more energy.  Have improved memory. It is important to recognize when your condition is getting worse. Contact your health care provider if your symptoms interfere with home or work and you do not feel like your condition is improving. Where to find help and support: You can get help and support from these sources:  Self-help groups.  Online and [employer].  A trusted spiritual leader.  Couples counseling.  Family education classes.  Family therapy. Follow these instructions at home:  Eat a healthy diet that includes plenty of vegetables, fruits, whole grains, low-fat dairy products, and lean protein. Do not eat a lot of foods that are high in solid fats, added sugars, or salt.  Exercise. Most adults should do the following: ? Exercise for at least 150 minutes each week. The exercise should increase your heart rate and make you sweat (moderate-intensity exercise). ? Strengthening exercises at least twice a week.  Cut down on caffeine, tobacco, alcohol, and other potentially  harmful substances.  Get the right amount and quality of sleep. Most adults need 7-9 hours of sleep each night.  Make choices that simplify your life.  Take over-the-counter and prescription medicines only as told by your health care provider.  Avoid caffeine, alcohol, and certain over-the-counter cold medicines. These may make you feel worse. Ask your pharmacist which medicines to avoid.  Keep all follow-up visits as told by your health care provider. This is important. Questions to ask your health care provider  Would I benefit from therapy?  How often should I follow up with a health care provider?  How long do I need to take medicine?  Are there any long-term side effects of my medicine?  Are there any alternatives to taking medicine? Contact a health care provider if:  You have a hard time staying focused or finishing daily tasks.  You spend many hours a day feeling worried about everyday life.  You become exhausted by worry.  You start to have headaches, feel tense, or have nausea.  You urinate more than normal.  You have diarrhea. Get help right away if:  You have a racing heart and shortness of breath.  You have thoughts of hurting yourself or others. If you ever feel like you may hurt yourself or others, or have thoughts about taking your own life, get help right away. You can go to your nearest emergency department or call:  Your local emergency services (911 in the [place]  A suicide crisis helpline, such as the [place] at (207)759-0166. This is open 24-hours a day. Summary  Taking steps to deal with stress can help calm you.  Medicines cannot cure anxiety disorders, but they can help ease symptoms.  Family, friends, and partners can play a big part in helping you recover from an anxiety disorder. This information is not intended to replace advice given to you by your health care provider. Make sure you discuss any questions  you have with your health care provider. Document Released: 07/27/2016 Document Revised: 07/15/2017 Document Reviewed: 07/27/2016 Elsevier Patient Education  2020 [employer].
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
## 2019-08-13 ENCOUNTER — Encounter: Payer: Self-pay | Admitting: Family Medicine

## 2019-08-13 LAB — C. TRACHOMATIS/N. GONORRHOEAE RNA
C. trachomatis RNA, TMA: NOT DETECTED
N. gonorrhoeae RNA, TMA: NOT DETECTED

## 2019-08-13 LAB — HIV ANTIBODY (ROUTINE TESTING W REFLEX): HIV 1&2 Ab, 4th Generation: NONREACTIVE

## 2019-08-13 LAB — RPR: RPR Ser Ql: NONREACTIVE

## 2019-08-15 ENCOUNTER — Encounter: Payer: Self-pay | Admitting: *Deleted

## 2019-08-16 ENCOUNTER — Telehealth: Payer: Self-pay | Admitting: Family Medicine

## 2019-08-16 DIAGNOSIS — N898 Other specified noninflammatory disorders of vagina: Secondary | ICD-10-CM | POA: Diagnosis not present

## 2019-08-16 NOTE — Telephone Encounter (Signed)
Copied from [place] [phone]. Topic: Quick Communication - See Telephone Encounter >> Aug 16, 2019  8:07 AM Loma Boston wrote: CRM for notification. See Telephone encounter for: 08/16/19.Pt called in and is working and states she has a yeast infection with a thick -light colorured discharge, pt wants something called in. Offered appointment but pt would like to inquire if something could be called into [id number] - [place], [place] [address]  Phone:  [phone] Fax:  [phone]  pt requested call back at [phone] if call in not possible. FU with pt, states had yeast infections like this before.

## 2019-08-16 NOTE — Telephone Encounter (Signed)
Spoke to pt and advised that due to holidays we are only have VV left and pt needs to be in ofice. Pt advise to go to local UC. Pt verbalized understanding.

## 2019-08-20 DIAGNOSIS — F4323 Adjustment disorder with mixed anxiety and depressed mood: Secondary | ICD-10-CM | POA: Diagnosis not present

## 2019-08-27 DIAGNOSIS — Z01419 Encounter for gynecological examination (general) (routine) without abnormal findings: Secondary | ICD-10-CM | POA: Diagnosis not present

## 2019-08-27 DIAGNOSIS — Z6826 Body mass index (BMI) 26.0-26.9, adult: Secondary | ICD-10-CM | POA: Diagnosis not present

## 2019-09-05 DIAGNOSIS — N83292 Other ovarian cyst, left side: Secondary | ICD-10-CM | POA: Diagnosis not present

## 2019-09-05 DIAGNOSIS — D251 Intramural leiomyoma of uterus: Secondary | ICD-10-CM | POA: Diagnosis not present

## 2019-09-05 DIAGNOSIS — N83291 Other ovarian cyst, right side: Secondary | ICD-10-CM | POA: Diagnosis not present

## 2019-09-05 DIAGNOSIS — D252 Subserosal leiomyoma of uterus: Secondary | ICD-10-CM | POA: Diagnosis not present

## 2019-09-05 DIAGNOSIS — Z124 Encounter for screening for malignant neoplasm of cervix: Secondary | ICD-10-CM | POA: Diagnosis not present

## 2019-09-05 DIAGNOSIS — Z1151 Encounter for screening for human papillomavirus (HPV): Secondary | ICD-10-CM | POA: Diagnosis not present

## 2019-09-13 ENCOUNTER — Ambulatory Visit: Payer: BC Managed Care – PPO | Admitting: Family Medicine

## 2019-10-07 ENCOUNTER — Other Ambulatory Visit: Payer: Self-pay | Admitting: Family Medicine

## 2019-10-07 DIAGNOSIS — E559 Vitamin D deficiency, unspecified: Secondary | ICD-10-CM

## 2019-10-11 ENCOUNTER — Ambulatory Visit: Payer: BC Managed Care – PPO | Admitting: Family Medicine

## 2019-10-15 ENCOUNTER — Encounter (HOSPITAL_COMMUNITY): Payer: Self-pay

## 2019-10-15 ENCOUNTER — Ambulatory Visit (HOSPITAL_COMMUNITY)
Admission: EM | Admit: 2019-10-15 | Discharge: 2019-10-15 | Disposition: A | Discharge status: Home / Self Care | Payer: BC Managed Care – PPO | Attending: Family Medicine | Admitting: Family Medicine

## 2019-10-15 ENCOUNTER — Other Ambulatory Visit: Payer: Self-pay

## 2019-10-15 ENCOUNTER — Ambulatory Visit (INDEPENDENT_AMBULATORY_CARE_PROVIDER_SITE_OTHER): Payer: BC Managed Care – PPO

## 2019-10-15 DIAGNOSIS — R0789 Other chest pain: Secondary | ICD-10-CM

## 2019-10-15 MED ORDER — IBUPROFEN 600 MG PO TABS: 600.0000 mg | ORAL_TABLET | Freq: Four times a day (QID) | ORAL | 0 refills | Status: DC | PRN

## 2019-10-15 NOTE — Discharge Instructions (Signed)
Take ibuprofen 600 mg 3 times a day with food Return as needed
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
## 2019-10-15 NOTE — ED Triage Notes (Signed)
Pt is here with chest discomfort that started this morning, pt has taken 1200mg  tab of ibproben to relieve discomfort.

## 2019-10-16 ENCOUNTER — Telehealth (INDEPENDENT_AMBULATORY_CARE_PROVIDER_SITE_OTHER): Payer: BC Managed Care – PPO | Admitting: Family Medicine

## 2019-10-16 DIAGNOSIS — E559 Vitamin D deficiency, unspecified: Secondary | ICD-10-CM

## 2019-10-16 DIAGNOSIS — Z3009 Encounter for other general counseling and advice on contraception: Secondary | ICD-10-CM

## 2019-10-16 DIAGNOSIS — F4323 Adjustment disorder with mixed anxiety and depressed mood: Secondary | ICD-10-CM

## 2019-10-16 DIAGNOSIS — Z8781 Personal history of (healed) traumatic fracture: Secondary | ICD-10-CM
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
## 2019-10-18 ENCOUNTER — Other Ambulatory Visit: Payer: BC Managed Care – PPO

## 2019-10-19 ENCOUNTER — Other Ambulatory Visit: Payer: BC Managed Care – PPO

## 2019-11-14 ENCOUNTER — Other Ambulatory Visit: Payer: Self-pay | Admitting: Family Medicine

## 2019-11-14 NOTE — Telephone Encounter (Signed)
Pt called in and cancelled her lab appointment on 11/19/2019 due to her having a new job and have to be at work at 8:00.  Pt stated that she would like to have a refill on her Vit-D.

## 2019-11-14 NOTE — Telephone Encounter (Signed)
Spoke with the pt and informed her of the message below.   

## 2019-11-14 NOTE — Telephone Encounter (Signed)
Will need to recheck vitamin D level to determine if prescription vitamin D is necessary.  Pt can take over-the-counter vitamin D 3 1000-2000 IU daily until able to have labs rechecked.

## 2019-11-19 ENCOUNTER — Other Ambulatory Visit: Payer: Self-pay

## 2019-12-10 ENCOUNTER — Ambulatory Visit (HOSPITAL_COMMUNITY)
Admission: EM | Admit: 2019-12-10 | Discharge: 2019-12-10 | Disposition: A | Discharge status: Home / Self Care | Payer: Self-pay | Attending: Family Medicine | Admitting: Family Medicine

## 2019-12-10 ENCOUNTER — Other Ambulatory Visit: Payer: Self-pay

## 2019-12-10 ENCOUNTER — Encounter (HOSPITAL_COMMUNITY): Payer: Self-pay

## 2019-12-10 DIAGNOSIS — Z3202 Encounter for pregnancy test, result negative: Secondary | ICD-10-CM

## 2019-12-10 DIAGNOSIS — R109 Unspecified abdominal pain: Secondary | ICD-10-CM

## 2019-12-10 LAB — POCT URINALYSIS DIP (DEVICE)
Bilirubin Urine: NEGATIVE
Glucose, UA: NEGATIVE mg/dL
Ketones, ur: NEGATIVE mg/dL
Leukocytes,Ua: NEGATIVE
Nitrite: NEGATIVE
Protein, ur: NEGATIVE mg/dL
Specific Gravity, Urine: 1.02 (ref 1.005–1.030)
Urobilinogen, UA: 0.2 mg/dL (ref 0.0–1.0)
pH: 5 (ref 5.0–8.0)

## 2019-12-10 LAB — POC URINE PREG, ED: Preg Test, Ur: NEGATIVE

## 2019-12-10 MED ORDER — KETOROLAC TROMETHAMINE 30 MG/ML IJ SOLN
30.0000 mg | Freq: Once | INTRAMUSCULAR | Status: AC
  Administered 2019-12-10: 30 mg via INTRAMUSCULAR

## 2019-12-10 MED ORDER — KETOROLAC TROMETHAMINE 30 MG/ML IJ SOLN
INTRAMUSCULAR | Status: AC
  Filled 2019-12-10: qty 1

## 2019-12-10 MED ORDER — HYDROCODONE-ACETAMINOPHEN 5-325 MG PO TABS: 2.0000 | ORAL_TABLET | ORAL | 0 refills | Status: DC | PRN

## 2019-12-10 MED ORDER — TAMSULOSIN HCL 0.4 MG PO CAPS: 0.4000 mg | ORAL_CAPSULE | Freq: Every day | ORAL | 0 refills | Status: AC

## 2019-12-10 NOTE — Discharge Instructions (Signed)
Your urine was not concerning for infection today.  Your pain is concerning for a possible kidney stone.   You  have received an injection in the office for pain today.   I have sent in a prescription for flomax and for hydrocodone to your pharmacy. Take these as prescribed.  Follow up with an ER or Med Center if these medications are not helping.
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
## 2019-12-10 NOTE — ED Triage Notes (Signed)
Patient reports flank and lower abdominal pain x2 hours.

## 2019-12-13 ENCOUNTER — Encounter: Payer: Self-pay | Admitting: Family Medicine

## 2019-12-13 ENCOUNTER — Telehealth (INDEPENDENT_AMBULATORY_CARE_PROVIDER_SITE_OTHER): Payer: Self-pay | Admitting: Family Medicine

## 2019-12-13 DIAGNOSIS — R109 Unspecified abdominal pain: Secondary | ICD-10-CM

## 2019-12-13 NOTE — Progress Notes (Signed)
Virtual Visit via Video Note  I connected with Baxter Hire on 12/13/19 at  4:30 PM EDT by a video enabled telemedicine application 2/2 [id number] pandemic and verified that I am speaking with the correct person using two identifiers.  Location patient: home Location provider:work or home office Persons participating in the virtual visit: patient, provider  I discussed the limitations of evaluation and management by telemedicine and the availability of in person appointments. The patient expressed understanding and agreed to proceed.   HPI: Pt states on Monday she started having pain in lower back and brief pain in LLQ.  Pt went to UC.  Hematuria was noted.   Pt given toradol which helped for a day.  Pt advised to go to medicine [facility] for CT.  Pt noticed the pain this am with deep inhale.  Pt denies hematuria, n/v, suprapubic pain, hematuria.  Pt recently started a new job.  ROS: See pertinent positives and negatives per HPI.  Past Medical History:  Diagnosis Date  . Allergy   . GERD (gastroesophageal reflux disease)   . Hay fever   . Ovarian cyst   . Yeast infection     Past Surgical History:  Procedure Laterality Date  . WISDOM TOOTH EXTRACTION      Family History  Problem Relation Age of Onset  . Hypertension Mother   . Healthy Father   . Heart failure Maternal Grandmother   . Diabetes Maternal Grandfather   . Lymphoma Paternal Grandmother   . Colon cancer Neg Hx   . Stomach cancer Neg Hx   . Rectal cancer Neg Hx   . Esophageal cancer Neg Hx   . Liver cancer Neg Hx      Current Outpatient Medications:  .  ergocalciferol (VITAMIN D2) 1.25 MG (50000 UT) capsule, once a week., Disp: , Rfl:  .  fluconazole (DIFLUCAN) 150 MG tablet, Take 150 mg by mouth once., Disp: , Rfl:  .  fluticasone (FLONASE) 50 MCG/ACT nasal spray, Place 2 sprays into both nostrils daily., Disp: 1 g, Rfl: 5 .  fluticasone (FLONASE) 50 MCG/ACT nasal spray, Place into the nose., Disp: , Rfl:  .   HYDROcodone-acetaminophen (NORCO/VICODIN) 5-325 MG tablet, Take 2 tablets by mouth every 4 (four) hours as needed., Disp: 10 tablet, Rfl: 0 .  ibuprofen (ADVIL) 600 MG tablet, Take 1 tablet (600 mg total) by mouth every 6 (six) hours as needed., Disp: 30 tablet, Rfl: 0 .  iron polysaccharides (FERREX 150) 150 MG capsule, 1 tab po daily, Disp: , Rfl:  .  tamsulosin (FLOMAX) 0.4 MG CAPS capsule, Take 1 capsule (0.4 mg total) by mouth daily., Disp: 30 capsule, Rfl: 0  EXAM:  VITALS per patient if applicable: RR between 123456 bpm  GENERAL: alert, oriented, appears well and in no acute distress  HEENT: atraumatic, conjunctiva clear, no obvious abnormalities on inspection of external nose and ears  NECK: normal movements of the head and neck  LUNGS: on inspection no signs of respiratory distress, breathing rate appears normal, no obvious gross SOB, gasping or wheezing  CV: no obvious cyanosis  MS: moves all visible extremities without noticeable abnormality  PSYCH/NEURO: pleasant and cooperative, no obvious depression or anxiety, speech and thought processing grossly intact  ASSESSMENT AND PLAN:  Discussed the following assessment and plan:  Flank pain -Stable -UA on 12/10/2019 with hematuria -Renal calculi suspected -Encouraged to increase p.o. intake of fluids -P.o. analgesics as needed -Given precautions - Plan: CT RENAL STONE STUDY  Follow-up as needed  I discussed the assessment and treatment plan with the patient. The patient was provided an opportunity to ask questions and all were answered. The patient agreed with the plan and demonstrated an understanding of the instructions.   The patient was advised to call back or seek an in-person evaluation if the symptoms worsen or if the condition fails to improve as anticipated.  Billie Ruddy, MD

## 2019-12-19 ENCOUNTER — Other Ambulatory Visit: Payer: Self-pay

## 2020-01-01 ENCOUNTER — Telehealth: Payer: Self-pay | Admitting: Family Medicine

## 2020-01-01 NOTE — Telephone Encounter (Signed)
Spoke with pt states that she was prescribed Rx for Flomax at the UC, pt state that she never picked up Rx from her pharmacy,wants advise on if she should pick Rx and start taking or should she not take it

## 2020-01-01 NOTE — Telephone Encounter (Signed)
Pt is calling in stating a few weeks ago she was referred to [facility] [facility] for a scan thinking it was a kidney stone.  Pt state at this present time she is no longer having the pain and want to know if Dr. Volanda Napoleon if she needs to keep the appointment with [place].  Pt would like to have a call back today so that she can call [facility] to cancel the appointment and not be charged a fee for not cancelling in a timely manner.

## 2020-01-01 NOTE — Telephone Encounter (Signed)
Spoke with Jessica Villarreal advised to go ahead with the imaging just incase the pain returns and the imaging may show the cause of the pain she was having, Jessica Villarreal state that she will keep the appointment.

## 2020-01-01 NOTE — Telephone Encounter (Signed)
FYI

## 2020-01-01 NOTE — Telephone Encounter (Signed)
Spoke with pt aware that she does not have to  pick up Rx if no longer having symptoms, pt verbalized understanding

## 2020-01-01 NOTE — Telephone Encounter (Signed)
If pt is no longer having the pain the scan can wait.  If she had a stone and was able to pass it she likely did by now.

## 2020-01-01 NOTE — Telephone Encounter (Signed)
Pt does not have to pick it up at this time if no longer having symptoms.

## 2020-01-02 ENCOUNTER — Other Ambulatory Visit: Payer: Self-pay

## 2020-03-21 ENCOUNTER — Ambulatory Visit (HOSPITAL_COMMUNITY)
Admission: EM | Admit: 2020-03-21 | Discharge: 2020-03-21 | Disposition: A | Discharge status: Home / Self Care | Payer: Self-pay | Attending: Family Medicine | Admitting: Family Medicine

## 2020-03-21 ENCOUNTER — Encounter (HOSPITAL_COMMUNITY): Payer: Self-pay

## 2020-03-21 ENCOUNTER — Other Ambulatory Visit: Payer: Self-pay

## 2020-03-21 DIAGNOSIS — Z8249 Family history of ischemic heart disease and other diseases of the circulatory system: Secondary | ICD-10-CM | POA: Insufficient documentation

## 2020-03-21 DIAGNOSIS — Z20822 Contact with and (suspected) exposure to covid-19: Secondary | ICD-10-CM | POA: Insufficient documentation

## 2020-03-21 DIAGNOSIS — Z791 Long term (current) use of non-steroidal anti-inflammatories (NSAID): Secondary | ICD-10-CM | POA: Insufficient documentation

## 2020-03-21 DIAGNOSIS — R000 Tachycardia, unspecified: Secondary | ICD-10-CM | POA: Insufficient documentation

## 2020-03-21 DIAGNOSIS — R0981 Nasal congestion: Secondary | ICD-10-CM | POA: Insufficient documentation

## 2020-03-21 DIAGNOSIS — K219 Gastro-esophageal reflux disease without esophagitis: Secondary | ICD-10-CM | POA: Insufficient documentation

## 2020-03-21 DIAGNOSIS — R519 Headache, unspecified: Secondary | ICD-10-CM | POA: Insufficient documentation

## 2020-03-21 LAB — SARS CORONAVIRUS 2 (TAT 6-24 HRS): SARS Coronavirus 2: NEGATIVE

## 2020-03-21 MED ORDER — NAPROXEN 500 MG PO TABS: 500.0000 mg | ORAL_TABLET | Freq: Two times a day (BID) | ORAL | 0 refills | Status: DC

## 2020-03-21 MED ORDER — FLUTICASONE PROPIONATE 50 MCG/ACT NA SUSP: 1.0000 | Freq: Every day | NASAL | 0 refills | Status: DC

## 2020-03-21 NOTE — ED Triage Notes (Signed)
Pt c/o headache to back of head, "above my left ear"   Pt also concerned about heart rate being elevated, 80-115
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
## 2020-03-21 NOTE — Discharge Instructions (Signed)
EKG normal COVID test pending Naprosyn twice dialy as needed for headache Flonase nasal spray, may also use claritin or zyrtec over the coutner Rest and fluids Follow up if not improving or worsening

## 2020-04-10 DIAGNOSIS — Z3141 Encounter for fertility testing: Secondary | ICD-10-CM | POA: Diagnosis not present

## 2020-04-14 ENCOUNTER — Other Ambulatory Visit: Payer: Self-pay

## 2020-04-14 ENCOUNTER — Other Ambulatory Visit (INDEPENDENT_AMBULATORY_CARE_PROVIDER_SITE_OTHER): Payer: 59

## 2020-04-14 ENCOUNTER — Ambulatory Visit: Payer: 59 | Admitting: Family Medicine

## 2020-04-14 DIAGNOSIS — E559 Vitamin D deficiency, unspecified: Secondary | ICD-10-CM

## 2020-04-14 DIAGNOSIS — R5383 Other fatigue: Secondary | ICD-10-CM

## 2020-04-14 NOTE — Addendum Note (Signed)
Addended by: Marrion Coy on: 04/14/2020 02:29 PM   Modules accepted: Orders

## 2020-04-15 ENCOUNTER — Encounter: Payer: Self-pay | Admitting: Family Medicine

## 2020-04-15 LAB — IRON,TIBC AND FERRITIN PANEL
%SAT: 13 % (calc) — ABNORMAL LOW (ref 16–45)
Ferritin: 11 ng/mL — ABNORMAL LOW (ref 16–154)
Iron: 55 ug/dL (ref 40–190)
TIBC: 412 mcg/dL (calc) (ref 250–450)

## 2020-04-15 LAB — VITAMIN B12: Vitamin B-12: 535 pg/mL (ref 200–1100)

## 2020-04-15 NOTE — Telephone Encounter (Signed)
Patient called wanting to know why someone hasn't called her to discuss her results that are in [place]. I explained that the provider is out of the office and will contact her as soon as the results are reviewed.  Please advise

## 2020-04-16 NOTE — Telephone Encounter (Signed)
Sent pt a MyChart message advising that Dr Volanda Napoleon has not reviewed her lab results and that pt should get a call from the office with lab results

## 2020-04-18 ENCOUNTER — Encounter: Payer: Self-pay | Admitting: Family Medicine

## 2020-04-18 ENCOUNTER — Other Ambulatory Visit: Payer: Self-pay

## 2020-04-18 DIAGNOSIS — R5383 Other fatigue: Secondary | ICD-10-CM

## 2020-04-18 NOTE — Telephone Encounter (Signed)
Spoke with pt this afternoon, scheduled for a lab appointment for further labs missed yesterday

## 2020-04-22 ENCOUNTER — Other Ambulatory Visit: Payer: 59

## 2020-04-22 ENCOUNTER — Other Ambulatory Visit: Payer: Self-pay

## 2020-04-22 DIAGNOSIS — R5383 Other fatigue: Secondary | ICD-10-CM | POA: Diagnosis not present

## 2020-04-22 LAB — CBC WITH DIFFERENTIAL/PLATELET
Absolute Monocytes: 282 cells/uL (ref 200–950)
Basophils Absolute: 42 cells/uL (ref 0–200)
Basophils Relative: 0.9 %
Eosinophils Absolute: 108 cells/uL (ref 15–500)
Eosinophils Relative: 2.3 %
HCT: 38.6 % (ref 35.0–45.0)
Hemoglobin: 12.5 g/dL (ref 11.7–15.5)
Lymphs Abs: 1951 cells/uL (ref 850–3900)
MCH: 28.5 pg (ref 27.0–33.0)
MCHC: 32.4 g/dL (ref 32.0–36.0)
MCV: 87.9 fL (ref 80.0–100.0)
MPV: 10 fL (ref 7.5–12.5)
Monocytes Relative: 6 %
Neutro Abs: 2317 cells/uL (ref 1500–7800)
Neutrophils Relative %: 49.3 %
Platelets: 307 10*3/uL (ref 140–400)
RBC: 4.39 10*6/uL (ref 3.80–5.10)
RDW: 14.1 % (ref 11.0–15.0)
Total Lymphocyte: 41.5 %
WBC: 4.7 10*3/uL (ref 3.8–10.8)

## 2020-05-09 ENCOUNTER — Telehealth: Payer: Self-pay | Admitting: Family Medicine

## 2020-05-09 NOTE — Telephone Encounter (Signed)
Pt want to get a TB Test and would like a call back.

## 2020-05-13 DIAGNOSIS — F419 Anxiety disorder, unspecified: Secondary | ICD-10-CM | POA: Diagnosis not present

## 2020-05-13 NOTE — Telephone Encounter (Signed)
Left message for pt to call the office and schedule a Nurse visit appointment for her TB test

## 2020-05-19 ENCOUNTER — Other Ambulatory Visit: Payer: Self-pay

## 2020-05-19 ENCOUNTER — Ambulatory Visit (INDEPENDENT_AMBULATORY_CARE_PROVIDER_SITE_OTHER): Payer: 59 | Admitting: Family Medicine

## 2020-05-19 ENCOUNTER — Encounter: Payer: Self-pay | Admitting: Family Medicine

## 2020-05-19 VITALS — BP 110/78 | HR 90 | Temp 98.8°F | Wt 146.6 lb

## 2020-05-19 DIAGNOSIS — M545 Low back pain, unspecified: Secondary | ICD-10-CM

## 2020-05-19 DIAGNOSIS — Z8781 Personal history of (healed) traumatic fracture: Secondary | ICD-10-CM | POA: Diagnosis not present

## 2020-05-19 DIAGNOSIS — Z111 Encounter for screening for respiratory tuberculosis: Secondary | ICD-10-CM | POA: Diagnosis not present

## 2020-05-19 NOTE — Patient Instructions (Addendum)
Back Injury Prevention Back injuries can be very painful. They can also be difficult to heal. After having one back injury, you are more likely to have another one again. It is important to learn how to avoid injuring or re-injuring your back. The following tips can help you to prevent a back injury. What actions can I take to prevent back injuries? Changes in your diet Talk with your doctor about what to eat. Some foods can make the bones strong.  Talk with your doctor about how much calcium and vitamin D you need each day. These nutrients help to prevent weakening of the bones (osteoporosis).  Eat foods that have calcium. These include: ? Dairy products. ? Green leafy vegetables. ? Food and drinks that have had calcium added to them (fortified).  Eat foods that have vitamin D. These include: ? Milk. ? Food and drinks that have had vitamin D added to them.  Take other supplements and vitamins only as told by your doctor. Physical fitness Physical fitness makes your bones and muscles strong. It also improves your balance and strength.  Exercise for 30 minutes per day on most days of the week, or as told by your doctor. Make sure to: ? Do aerobic exercises, such as walking, jogging, biking, or swimming. ? Do exercises that increase balance and strength, such as tai chi and yoga. ? Do stretching exercises. This helps with flexibility. ? Develop strong belly (abdominal) muscles. Your belly muscles help to support your back.  Stay at a healthy weight. This lowers your risk of a back injury. Good posture        Prevent back injuries by developing and maintaining a good posture. To do this:  Sit up straight and stand up straight. Avoid leaning forward when you sit or hunching over when you stand.  Choose chairs that have good low-back (lumbar) support.  If you work at a desk: ? Sit close to it so you do not need to lean over. ? Keep your chin tucked in. ? Keep your neck drawn  back. ? Keep your elbows bent so that your arms make a corner (right angle).  When you drive: ? Sit high and close to the steering wheel. Add a low-back support to your car seat, if needed. ? Take breaks every hour if you are driving for long periods of time.  Avoid sitting or standing in one position for very long. Take breaks to get up, stretch, and walk around at least once every hour.  Sleep on your side with your knees slightly bent, or sleep on your back with a pillow under your knees.  Lifting, twisting, and reaching   Heavy lifting ? Avoid heavy lifting, especially lifting over and over again. If you must do heavy lifting:  Stretch before lifting.  Work slowly.  Rest between lifts.  Use a tool such as a cart or a dolly to move objects if one is available.  Make several small trips instead of carrying one heavy load.  Ask for help when you need it, especially when moving big objects. ? Follow these steps when lifting:  Stand with your feet shoulder-width apart.  Get as close to the object as you can. Do not pick up a heavy object that is far from your body.  Use handles or lifting straps if they are available.  Bend at your knees. Squat down, but keep your heels off the floor.  Keep your shoulders back. Keep your chin tucked in. Keep  your back straight.  Lift the object slowly while you tighten the muscles in your legs, belly, and bottom. Keep the object as close to the center of your body as possible. ? Follow these steps when putting down a heavy load:  Stand with your feet shoulder-width apart.  Lower the object slowly while you tighten the muscles in your legs, belly, and bottom. Keep the object as close to the center of your body as possible.  Keep your shoulders back. Keep your chin tucked in. Keep your back straight.  Bend at your knees. Squat down, but keep your heels off the floor.  Use handles or lifting straps if they are available.  Twisting and  reaching ? Avoid lifting heavy objects above your waist. ? Do not twist at your waist while you are lifting or carrying a load. If you need to turn, move your feet. ? Do not bend over without bending at your knees. ? Avoid reaching over your head, across a table, or for an object on a high surface. Other things to do   Avoid wet floors and icy ground. Keep sidewalks clear of ice to prevent falls.  Do not sleep on a mattress that is too soft or too hard.  Store heavier objects on shelves at waist level.  Store lighter objects on lower or higher shelves.  Find ways to lower your stress, such as: ? Exercise. ? Massage. ? Relaxation techniques.  Talk with your doctor if you feel anxious or depressed. These conditions can make back pain worse.  Wear flat heel shoes with cushioned soles.  Use both shoulder straps when carrying a backpack.  Do not use any products that contain nicotine or tobacco, such as cigarettes and e-cigarettes. If you need help quitting, ask your doctor. Summary  Back injuries can be very painful and difficult to heal.  You can keep your back healthy by making certain changes. These include eating foods that make bones strong, working on being physically fit, developing a good posture, and lifting heavy objects in a safe way. This information is not intended to replace advice given to you by your health care provider. Make sure you discuss any questions you have with your health care provider. Document Revised: 04/25/2019 Document Reviewed: 09/23/2017 Elsevier Patient Education  [place] [facility] Ask your health care provider which exercises are safe for you. Do exercises exactly as told by your health care provider and adjust them as directed. It is normal to feel mild stretching, pulling, tightness, or discomfort as you do these exercises. Stop right away if you feel sudden pain or your pain gets worse. Do not begin these  exercises until told by your health care provider. Stretching and range-of-motion exercises These exercises warm up your muscles and joints and improve the movement and flexibility of your back. These exercises also help to relieve pain, numbness, and tingling. Lumbar rotation  1. Lie on your back on a firm surface and bend your knees. 2. Straighten your arms out to your sides so each arm forms a 90-degree angle (right angle) with a side of your body. 3. Slowly move (rotate) both of your knees to one side of your body until you feel a stretch in your lower back (lumbar). Try not to let your shoulders lift off the floor. 4. Hold this position for __________ seconds. 5. Tense your abdominal muscles and slowly move your knees back to the starting position. 6. Repeat this  exercise on the other side of your body. Repeat __________ times. Complete this exercise __________ times a day. Single knee to chest  1. Lie on your back on a firm surface with both legs straight. 2. Bend one of your knees. Use your hands to move your knee up toward your chest until you feel a gentle stretch in your lower back and buttock. ? Hold your leg in this position by holding on to the front of your knee. ? Keep your other leg as straight as possible. 3. Hold this position for __________ seconds. 4. Slowly return to the starting position. 5. Repeat with your other leg. Repeat __________ times. Complete this exercise __________ times a day. Prone extension on elbows  1. Lie on your abdomen on a firm surface (prone position). 2. Prop yourself up on your elbows. 3. Use your arms to help lift your chest up until you feel a gentle stretch in your abdomen and your lower back. ? This will place some of your body weight on your elbows. If this is uncomfortable, try stacking pillows under your chest. ? Your hips should stay down, against the surface that you are lying on. Keep your hip and back muscles relaxed. 4. Hold this  position for __________ seconds. 5. Slowly relax your upper body and return to the starting position. Repeat __________ times. Complete this exercise __________ times a day. Strengthening exercises These exercises build strength and endurance in your back. Endurance is the ability to use your muscles for a long time, even after they get tired. Pelvic tilt This exercise strengthens the muscles that lie deep in the abdomen. 1. Lie on your back on a firm surface. Bend your knees and keep your feet flat on the floor. 2. Tense your abdominal muscles. Tip your pelvis up toward the ceiling and flatten your lower back into the floor. ? To help with this exercise, you may place a small towel under your lower back and try to push your back into the towel. 3. Hold this position for __________ seconds. 4. Let your muscles relax completely before you repeat this exercise. Repeat __________ times. Complete this exercise __________ times a day. Alternating arm and leg raises  1. Get on your hands and knees on a firm surface. If you are on a hard floor, you may want to use padding, such as an exercise mat, to cushion your knees. 2. Line up your arms and legs. Your hands should be directly below your shoulders, and your knees should be directly below your hips. 3. Lift your left leg behind you. At the same time, raise your right arm and straighten it in front of you. ? Do not lift your leg higher than your hip. ? Do not lift your arm higher than your shoulder. ? Keep your abdominal and back muscles tight. ? Keep your hips facing the ground. ? Do not arch your back. ? Keep your balance carefully, and do not hold your breath. 4. Hold this position for __________ seconds. 5. Slowly return to the starting position. 6. Repeat with your right leg and your left arm. Repeat __________ times. Complete this exercise __________ times a day. Abdominal set with straight leg raise  1. Lie on your back on a firm  surface. 2. Bend one of your knees and keep your other leg straight. 3. Tense your abdominal muscles and lift your straight leg up, 4-6 inches (10-15 cm) off the ground. 4. Keep your abdominal muscles tight and hold this  position for __________ seconds. ? Do not hold your breath. ? Do not arch your back. Keep it flat against the ground. 5. Keep your abdominal muscles tense as you slowly lower your leg back to the starting position. 6. Repeat with your other leg. Repeat __________ times. Complete this exercise __________ times a day. Single leg lower with bent knees 1. Lie on your back on a firm surface. 2. Tense your abdominal muscles and lift your feet off the floor, one foot at a time, so your knees and hips are bent in 90-degree angles (right angles). ? Your knees should be over your hips and your lower legs should be parallel to the floor. 3. Keeping your abdominal muscles tense and your knee bent, slowly lower one of your legs so your toe touches the ground. 4. Lift your leg back up to return to the starting position. ? Do not hold your breath. ? Do not let your back arch. Keep your back flat against the ground. 5. Repeat with your other leg. Repeat __________ times. Complete this exercise __________ times a day. Posture and body mechanics Good posture and healthy body mechanics can help to relieve stress in your body's tissues and joints. Body mechanics refers to the movements and positions of your body while you do your daily activities. Posture is part of body mechanics. Good posture means:  Your spine is in its natural S-curve position (neutral).  Your shoulders are pulled back slightly.  Your head is not tipped forward. Follow these guidelines to improve your posture and body mechanics in your everyday activities. Standing   When standing, keep your spine neutral and your feet about hip width apart. Keep a slight bend in your knees. Your ears, shoulders, and hips should line  up.  When you do a task in which you stand in one place for a long time, place one foot up on a stable object that is 2-4 inches (5-10 cm) high, such as a footstool. This helps keep your spine neutral. Sitting   When sitting, keep your spine neutral and keep your feet flat on the floor. Use a footrest, if necessary, and keep your thighs parallel to the floor. Avoid rounding your shoulders, and avoid tilting your head forward.  When working at a desk or a computer, keep your desk at a height where your hands are slightly lower than your elbows. Slide your chair under your desk so you are close enough to maintain good posture.  When working at a computer, place your monitor at a height where you are looking straight ahead and you do not have to tilt your head forward or downward to look at the screen. Resting  When lying down and resting, avoid positions that are most painful for you.  If you have pain with activities such as sitting, bending, stooping, or squatting, lie in a position in which your body does not bend very much. For example, avoid curling up on your side with your arms and knees near your chest (fetal position).  If you have pain with activities such as standing for a long time or reaching with your arms, lie with your spine in a neutral position and bend your knees slightly. Try the following positions: ? Lying on your side with a pillow between your knees. ? Lying on your back with a pillow under your knees. Lifting   When lifting objects, keep your feet at least shoulder width apart and tighten your abdominal muscles.  Bend your  knees and hips and keep your spine neutral. It is important to lift using the strength of your legs, not your back. Do not lock your knees straight out.  Always ask for help to lift heavy or awkward objects. This information is not intended to replace advice given to you by your health care provider. Make sure you discuss any questions you have  with your health care provider. Document Revised: 11/24/2018 Document Reviewed: 08/24/2018 Elsevier Patient Education  [place].  Back Exercises The following exercises strengthen the muscles that help to support the trunk and back. They also help to keep the lower back flexible. Doing these exercises can help to prevent back pain or lessen existing pain.  If you have back pain or discomfort, try doing these exercises 2-3 times each day or as told by your health care provider.  As your pain improves, do them once each day, but increase the number of times that you repeat the steps for each exercise (do more repetitions).  To prevent the recurrence of back pain, continue to do these exercises once each day or as told by your health care provider. Do exercises exactly as told by your health care provider and adjust them as directed. It is normal to feel mild stretching, pulling, tightness, or discomfort as you do these exercises, but you should stop right away if you feel sudden pain or your pain gets worse. Exercises Single knee to chest Repeat these steps 3-5 times for each leg: 1. Lie on your back on a firm bed or the floor with your legs extended. 2. Bring one knee to your chest. Your other leg should stay extended and in contact with the floor. 3. Hold your knee in place by grabbing your knee or thigh with both hands and hold. 4. Pull on your knee until you feel a gentle stretch in your lower back or buttocks. 5. Hold the stretch for 10-30 seconds. 6. Slowly release and straighten your leg. Pelvic tilt Repeat these steps 5-10 times: 1. Lie on your back on a firm bed or the floor with your legs extended. 2. Bend your knees so they are pointing toward the ceiling and your feet are flat on the floor. 3. Tighten your lower abdominal muscles to press your lower back against the floor. This motion will tilt your pelvis so your tailbone points up toward the ceiling instead of pointing  to your feet or the floor. 4. With gentle tension and even breathing, hold this position for 5-10 seconds. Cat-cow Repeat these steps until your lower back becomes more flexible: 1. Get into a hands-and-knees position on a firm surface. Keep your hands under your shoulders, and keep your knees under your hips. You may place padding under your knees for comfort. 2. Let your head hang down toward your chest. Contract your abdominal muscles and point your tailbone toward the floor so your lower back becomes rounded like the back of a cat. 3. Hold this position for 5 seconds. 4. Slowly lift your head, let your abdominal muscles relax and point your tailbone up toward the ceiling so your back forms a sagging arch like the back of a cow. 5. Hold this position for 5 seconds.  Press-ups Repeat these steps 5-10 times: 1. Lie on your abdomen (face-down) on the floor. 2. Place your palms near your head, about shoulder-width apart. 3. Keeping your back as relaxed as possible and keeping your hips on the floor, slowly straighten your arms to raise  the top half of your body and lift your shoulders. Do not use your back muscles to raise your upper torso. You may adjust the placement of your hands to make yourself more comfortable. 4. Hold this position for 5 seconds while you keep your back relaxed. 5. Slowly return to lying flat on the floor.  Bridges Repeat these steps 10 times: 1. Lie on your back on a firm surface. 2. Bend your knees so they are pointing toward the ceiling and your feet are flat on the floor. Your arms should be flat at your sides, next to your body. 3. Tighten your buttocks muscles and lift your buttocks off the floor until your waist is at almost the same height as your knees. You should feel the muscles working in your buttocks and the back of your thighs. If you do not feel these muscles, slide your feet 1-2 inches farther away from your buttocks. 4. Hold this position for 3-5  seconds. 5. Slowly lower your hips to the starting position, and allow your buttocks muscles to relax completely. If this exercise is too easy, try doing it with your arms crossed over your chest. Abdominal crunches Repeat these steps 5-10 times: 1. Lie on your back on a firm bed or the floor with your legs extended. 2. Bend your knees so they are pointing toward the ceiling and your feet are flat on the floor. 3. Cross your arms over your chest. 4. Tip your chin slightly toward your chest without bending your neck. 5. Tighten your abdominal muscles and slowly raise your trunk (torso) high enough to lift your shoulder blades a tiny bit off the floor. Avoid raising your torso higher than that because it can put too much stress on your low back and does not help to strengthen your abdominal muscles. 6. Slowly return to your starting position. Back lifts Repeat these steps 5-10 times: 1. Lie on your abdomen (face-down) with your arms at your sides, and rest your forehead on the floor. 2. Tighten the muscles in your legs and your buttocks. 3. Slowly lift your chest off the floor while you keep your hips pressed to the floor. Keep the back of your head in line with the curve in your back. Your eyes should be looking at the floor. 4. Hold this position for 3-5 seconds. 5. Slowly return to your starting position. Contact a health care provider if:  Your back pain or discomfort gets much worse when you do an exercise.  Your worsening back pain or discomfort does not lessen within 2 hours after you exercise. If you have any of these problems, stop doing these exercises right away. Do not do them again unless your health care provider says that you can. Get help right away if:  You develop sudden, severe back pain. If this happens, stop doing the exercises right away. Do not do them again unless your health care provider says that you can. This information is not intended to replace advice given to  you by your health care provider. Make sure you discuss any questions you have with your health care provider. Document Revised: 12/07/2018 Document Reviewed: 05/04/2018 Elsevier Patient Education  [place].  Tuberculin Skin Test Why am I having this test? The tuberculin skin test is used to check whether a person has been exposed to the bacteria that causes tuberculosis (TB) (Mycobacterium tuberculosis). Tuberculosis is a bacterial infection that usually affects the lungs but can affect other parts of the body.  You may have a tuberculin skin test if:  You have possible symptoms of TB, such as: ? Coughing up blood, mucus from the lungs (sputum), or both. ? A cough that lasts three weeks or longer. ? Chest pain, or pain while breathing or coughing. ? Unexplained weight loss. ? Fatigue and weakness. ? Fever, sweating, and chills. ? Loss of appetite.  You are at high risk for getting TB. You may be at high risk if you: ? Inject illegal drugs or share needles. ? Have HIV or other diseases that affect the disease-fighting (immune) system. ? Work in a health care facility. ? Live in a high-risk community, such as a homeless shelter, nursing home, or correctional facility. ? Have had contact with someone who has TB. ? Are from or have traveled to a country where TB is common. If you are at high risk, you may need to have regular TB screenings. TB screening may be required when starting a new job, such as becoming a Public house manager or a Pharmacist, hospital. Colleges or universities may require TB screening for new students. What is being tested? This test checks for the presence of TB antibodies in the body. Antibodies are a type of cell that is part of the body's immune system. After you get an infection, your body makes antibodies that stay in your body after you recover and protect you from getting the same infection again. Tell a health care provider about:  Any allergies you have.  All  medicines you are taking, including vitamins, herbs, eye drops, creams, and over-the-counter medicines.  Any blood disorders you have.  Any surgeries you have had.  Any medical conditions you have.  Whether you are pregnant or may be pregnant. What happens during the test?  Your health care provider will inject a solution called PPD (purified protein derivative) under the first layer of skin on your arm. This causes a small, blister-like bump to form over the area temporarily. PPD is made from the bacteria that causes TB. PPD causes your immune system to react, but it does not get you sick with TB. You may feel mild stinging as this happens. Afterward, the area may itch or burn. How are the results reported? To get your test results, you will need to see your health care provider again within 2-3 days after you received the injection. It is important to follow your health care provider's instructions about when to be seen again. If you are not seen within 2-3 days, you may need to have the test repeated. At your follow-up visit, your health care provider will measure the area where the PPD was injected to see if the bump has gotten larger due to swelling. Your results will be reported as positive or negative:  If the bump has disappeared or is small, your test result is negative. Negative means that you do not have the antibodies.  If the bump is large, your test result is positive. Positive means that you have the antibodies. Swelling is caused by the antibodies reacting with the PPD. The skin may also turn red around the bump. A false-positive result can occur. A false positive is incorrect because it means that a condition is present when it is not. A false-negative result can occur. A false negative is incorrect because it means that a condition is not present when it is. False negatives are rare and are more likely to occur in older people and in people who have weakened immune  systems. What do the results mean? A negative result means that it is unlikely that you have TB or that you have been exposed to TB bacteria. This test may be repeated, or you may have a blood test to check for TB. This is because your body may not react to the tuberculin skin test until several weeks after exposure to TB bacteria. A positive result means that you have been exposed to TB, and you may need more tests to determine if you have:  Active TB, also called TB disease. This means that you have TB symptoms and your infection can spread to others (you are contagious).  Latent TB. This means that you do not have any symptoms of TB and you are not contagious. Latent TB can turn into active TB. Talk with your health care provider about what your results mean. Questions to ask your health care provider Ask your health care provider, or the department that is doing the test:  When will my results be ready?  How will I get my results?  What are my treatment options?  What other tests do I need?  What are my next steps? Summary  The tuberculin skin test is used to check whether a person has been exposed to the bacteria that causes tuberculosis (TB).  Your health care provider will inject a solution known as PPD (purified protein derivative) under the first layer of skin on your arm.  After 2-3 days, your health care provider will measure the area where the PPD was injected to see if the bump has gotten larger due to swelling.  Your results will be reported as positive or negative. A positive result means that you have been exposed to TB. A negative result means that it is unlikely that you have TB or that you have been exposed to TB bacteria. This information is not intended to replace advice given to you by your health care provider. Make sure you discuss any questions you have with your health care provider. Document Revised: 07/15/2017 Document Reviewed: 04/13/2017 Elsevier Patient  Education  [place].  Hip Exercises Ask your health care provider which exercises are safe for you. Do exercises exactly as told by your health care provider and adjust them as directed. It is normal to feel mild stretching, pulling, tightness, or discomfort as you do these exercises. Stop right away if you feel sudden pain or your pain gets worse. Do not begin these exercises until told by your health care provider. Stretching and range-of-motion exercises These exercises warm up your muscles and joints and improve the movement and flexibility of your hip. These exercises also help to relieve pain, numbness, and tingling. You may be asked to limit your range of motion if you had a hip replacement. Talk to your health care provider about these restrictions. Hamstrings, supine  3. Lie on your back (supine position). 4. Loop a belt or towel over the ball of your left / right foot. The ball of your foot is on the walking surface, right under your toes. 5. Straighten your left / right knee and slowly pull on the belt or towel to raise your leg until you feel a gentle stretch behind your knee (hamstring). ? Do not let your knee bend while you do this. ? Keep your other leg flat on the floor. 6. Hold this position for __________ seconds. 7. Slowly return your leg to the starting position. Repeat __________ times. Complete this exercise __________ times a day.  Hip rotation  7. Lie on your back on a firm surface. 8. With your left / right hand, gently pull your left / right knee toward the shoulder that is on the same side of the body. Stop when your knee is pointing toward the ceiling. 9. Hold your left / right ankle with your other hand. 10. Keeping your knee steady, gently pull your left / right ankle toward your other shoulder until you feel a stretch in your buttocks. ? Keep your hips and shoulders firmly planted while you do this stretch. 11. Hold this position for __________  seconds. Repeat __________ times. Complete this exercise __________ times a day. Seated stretch This exercise is sometimes called hamstrings and adductors stretch. 3. Sit on the floor with your legs stretched wide. Keep your knees straight during this exercise. 4. Keeping your head and back in a straight line, bend at your waist to reach for your left foot (position A). You should feel a stretch in your right inner thigh (adductors). 5. Hold this position for __________ seconds. Then slowly return to the upright position. 6. Keeping your head and back in a straight line, bend at your waist to reach forward (position B). You should feel a stretch behind both of your thighs and knees (hamstrings). 7. Hold this position for __________ seconds. Then slowly return to the upright position. 8. Keeping your head and back in a straight line, bend at your waist to reach for your right foot (position C). You should feel a stretch in your left inner thigh (adductors). 9. Hold this position for __________ seconds. Then slowly return to the upright position. Repeat __________ times. Complete this exercise __________ times a day. Lunge This exercise stretches the muscles of the hip (hip flexors). 3. Place your left / right knee on the floor and bend your other knee so that is directly over your ankle. You should be half-kneeling. 4. Keep good posture with your head over your shoulders. 5. Tighten your buttocks to point your tailbone downward. This will prevent your back from arching too much. 6. You should feel a gentle stretch in the front of your left / right thigh and hip. If you do not feel a stretch, slide your other foot forward slightly and then slowly lunge forward with your chest up until your knee once again lines up over your ankle. ? Make sure your tailbone continues to point downward. 7. Hold this position for __________ seconds. 8. Slowly return to the starting position. Repeat __________ times.  Complete this exercise __________ times a day. Strengthening exercises These exercises build strength and endurance in your hip. Endurance is the ability to use your muscles for a long time, even after they get tired. Bridge This exercise strengthens the muscles of your hip (hip extensors). 6. Lie on your back on a firm surface with your knees bent and your feet flat on the floor. 7. Tighten your buttocks muscles and lift your bottom off the floor until the trunk of your body and your hips are level with your thighs. ? Do not arch your back. ? You should feel the muscles working in your buttocks and the back of your thighs. If you do not feel these muscles, slide your feet 1-2 inches (2.5-5 cm) farther away from your buttocks. 8. Hold this position for __________ seconds. 9. Slowly lower your hips to the starting position. 10. Let your muscles relax completely between repetitions. Repeat __________ times. Complete this exercise __________ times a day.  Straight leg raises, side-lying This exercise strengthens the muscles that move the hip joint away from the center of the body (hip abductors). 5. Lie on your side with your left / right leg in the top position. Lie so your head, shoulder, hip, and knee line up. You may bend your bottom knee slightly to help you balance. 6. Roll your hips slightly forward, so your hips are stacked directly over each other and your left / right knee is facing forward. 7. Leading with your heel, lift your top leg 4-6 inches (10-15 cm). You should feel the muscles in your top hip lifting. ? Do not let your foot drift forward. ? Do not let your knee roll toward the ceiling. 8. Hold this position for __________ seconds. 9. Slowly return to the starting position. 10. Let your muscles relax completely between repetitions. Repeat __________ times. Complete this exercise __________ times a day. Straight leg raises, side-lying This exercise strengthens the muscles that  move the hip joint toward the center of the body (hip adductors). 6. Lie on your side with your left / right leg in the bottom position. Lie so your head, shoulder, hip, and knee line up. You may place your upper foot in front to help you balance. 7. Roll your hips slightly forward, so your hips are stacked directly over each other and your left / right knee is facing forward. 8. Tense the muscles in your inner thigh and lift your bottom leg 4-6 inches (10-15 cm). 9. Hold this position for __________ seconds. 10. Slowly return to the starting position. 11. Let your muscles relax completely between repetitions. Repeat __________ times. Complete this exercise __________ times a day. Straight leg raises, supine This exercise strengthens the muscles in the front of your thigh (quadriceps). 1. Lie on your back (supine position) with your left / right leg extended and your other knee bent. 2. Tense the muscles in the front of your left / right thigh. You should see your kneecap slide up or see increased dimpling just above your knee. 3. Keep these muscles tight as you raise your leg 4-6 inches (10-15 cm) off the floor. Do not let your knee bend. 4. Hold this position for __________ seconds. 5. Keep these muscles tense as you lower your leg. 6. Relax the muscles slowly and completely between repetitions. Repeat __________ times. Complete this exercise __________ times a day. Hip abductors, standing This exercise strengthens the muscles that move the leg and hip joint away from the center of the body (hip abductors). 1. Tie one end of a rubber exercise band or tubing to a secure surface, such as a chair, table, or pole. 2. Loop the other end of the band or tubing around your left / right ankle. 3. Keeping your ankle with the band or tubing directly opposite the secured end, step away until there is tension in the tubing or band. Hold on to a chair, table, or pole as needed for balance. 4. Lift your  left / right leg out to your side. While you do this: ? Keep your back upright. ? Keep your shoulders over your hips. ? Keep your toes pointing forward. ? Make sure to use your hip muscles to slowly lift your leg. Do not tip your body or forcefully lift your leg. 5. Hold this position for __________ seconds. 6. Slowly return to the starting position. Repeat __________ times. Complete this exercise __________ times a day. Squats This exercise strengthens the muscles in the front  of your thigh (quadriceps). 1. Stand in a door frame so your feet and knees are in line with the frame. You may place your hands on the frame for balance. 2. Slowly bend your knees and lower your hips like you are going to sit in a chair. ? Keep your lower legs in a straight-up-and-down position. ? Do not let your hips go lower than your knees. ? Do not bend your knees lower than told by your health care provider. ? If your hip pain increases, do not bend as low. 3. Hold this position for ___________ seconds. 4. Slowly push with your legs to return to standing. Do not use your hands to pull yourself to standing. Repeat __________ times. Complete this exercise __________ times a day. This information is not intended to replace advice given to you by your health care provider. Make sure you discuss any questions you have with your health care provider. Document Revised: 03/08/2019 Document Reviewed: 06/13/2018 Elsevier Patient Education  [place].
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
## 2020-05-27 ENCOUNTER — Other Ambulatory Visit: Payer: 59

## 2020-05-27 ENCOUNTER — Ambulatory Visit: Payer: 59 | Admitting: *Deleted

## 2020-05-27 ENCOUNTER — Other Ambulatory Visit: Payer: Self-pay

## 2020-05-27 DIAGNOSIS — Z111 Encounter for screening for respiratory tuberculosis: Secondary | ICD-10-CM | POA: Diagnosis not present

## 2020-05-27 NOTE — Progress Notes (Signed)
Spoke to patient about getting the TB skin less than 2 weeks from the previous. Explained the possibility of getting a false positive.   Dr. [place] gave verbal okay to order the quantiferon gold assay. Order has been entered and patient agreed with the lab test vs the skin placement.   Lab was drawn today.

## 2020-05-28 ENCOUNTER — Other Ambulatory Visit: Payer: Self-pay

## 2020-05-28 ENCOUNTER — Ambulatory Visit (HOSPITAL_COMMUNITY)
Admission: EM | Admit: 2020-05-28 | Discharge: 2020-05-28 | Disposition: A | Discharge status: Home / Self Care | Payer: 59 | Attending: Internal Medicine | Admitting: Internal Medicine

## 2020-05-28 ENCOUNTER — Encounter: Payer: Self-pay | Admitting: Family Medicine

## 2020-05-28 DIAGNOSIS — Z20822 Contact with and (suspected) exposure to covid-19: Secondary | ICD-10-CM | POA: Insufficient documentation

## 2020-05-28 LAB — SARS CORONAVIRUS 2 (TAT 6-24 HRS): SARS Coronavirus 2: NEGATIVE

## 2020-05-28 NOTE — ED Triage Notes (Signed)
Patient in for covid test  Denies fever, n/v, diarrhea, cough, congestion, runny nose, sob, st or other uri sxs

## 2020-05-29 LAB — QUANTIFERON-TB GOLD PLUS
Mitogen-NIL: 7.17 IU/mL
NIL: 0.03 IU/mL
QuantiFERON-TB Gold Plus: NEGATIVE
TB1-NIL: 0 IU/mL
TB2-NIL: 0.01 IU/mL

## 2020-06-09 DIAGNOSIS — D251 Intramural leiomyoma of uterus: Secondary | ICD-10-CM | POA: Diagnosis not present

## 2020-06-09 DIAGNOSIS — Z3162 Encounter for fertility preservation counseling: Secondary | ICD-10-CM | POA: Diagnosis not present

## 2020-06-09 DIAGNOSIS — N83291 Other ovarian cyst, right side: Secondary | ICD-10-CM | POA: Diagnosis not present

## 2020-06-09 DIAGNOSIS — N83292 Other ovarian cyst, left side: Secondary | ICD-10-CM | POA: Diagnosis not present

## 2020-08-05 DIAGNOSIS — L659 Nonscarring hair loss, unspecified: Secondary | ICD-10-CM | POA: Diagnosis not present

## 2020-08-05 DIAGNOSIS — L668 Other cicatricial alopecia: Secondary | ICD-10-CM | POA: Diagnosis not present

## 2020-08-05 DIAGNOSIS — L700 Acne vulgaris: Secondary | ICD-10-CM | POA: Diagnosis not present

## 2020-08-05 DIAGNOSIS — L810 Postinflammatory hyperpigmentation: Secondary | ICD-10-CM | POA: Diagnosis not present

## 2020-08-13 ENCOUNTER — Ambulatory Visit: Payer: 59 | Admitting: Family Medicine

## 2020-08-13 ENCOUNTER — Encounter: Payer: Self-pay | Admitting: Family Medicine

## 2020-08-13 ENCOUNTER — Other Ambulatory Visit: Payer: Self-pay

## 2020-08-13 VITALS — BP 108/78 | HR 86 | Temp 99.0°F | Wt 148.4 lb

## 2020-08-13 DIAGNOSIS — R5383 Other fatigue: Secondary | ICD-10-CM | POA: Diagnosis not present

## 2020-08-13 DIAGNOSIS — E559 Vitamin D deficiency, unspecified: Secondary | ICD-10-CM

## 2020-08-13 DIAGNOSIS — R7989 Other specified abnormal findings of blood chemistry: Secondary | ICD-10-CM | POA: Diagnosis not present

## 2020-08-13 LAB — CBC WITH DIFFERENTIAL/PLATELET
Basophils Absolute: 0 10*3/uL (ref 0.0–0.1)
Basophils Relative: 0.5 % (ref 0.0–3.0)
Eosinophils Absolute: 0.1 10*3/uL (ref 0.0–0.7)
Eosinophils Relative: 2.2 % (ref 0.0–5.0)
HCT: 39.1 % (ref 36.0–46.0)
Hemoglobin: 13 g/dL (ref 12.0–15.0)
Lymphocytes Relative: 33.9 % (ref 12.0–46.0)
Lymphs Abs: 1.8 10*3/uL (ref 0.7–4.0)
MCHC: 33.1 g/dL (ref 30.0–36.0)
MCV: 86.1 fl (ref 78.0–100.0)
Monocytes Absolute: 0.4 10*3/uL (ref 0.1–1.0)
Monocytes Relative: 7.1 % (ref 3.0–12.0)
Neutro Abs: 3 10*3/uL (ref 1.4–7.7)
Neutrophils Relative %: 56.3 % (ref 43.0–77.0)
Platelets: 319 10*3/uL (ref 150.0–400.0)
RBC: 4.54 Mil/uL (ref 3.87–5.11)
RDW: 15.5 % (ref 11.5–15.5)
WBC: 5.3 10*3/uL (ref 4.0–10.5)

## 2020-08-13 LAB — VITAMIN B12: Vitamin B-12: 420 pg/mL (ref 211–911)

## 2020-08-13 LAB — COMPREHENSIVE METABOLIC PANEL
ALT: 9 U/L (ref 0–35)
AST: 14 U/L (ref 0–37)
Albumin: 4.6 g/dL (ref 3.5–5.2)
Alkaline Phosphatase: 52 U/L (ref 39–117)
BUN: 10 mg/dL (ref 6–23)
CO2: 27 mEq/L (ref 19–32)
Calcium: 9.9 mg/dL (ref 8.4–10.5)
Chloride: 101 mEq/L (ref 96–112)
Creatinine, Ser: 0.82 mg/dL (ref 0.40–1.20)
GFR: 92.19 mL/min (ref >60.00)
Glucose, Bld: 75 mg/dL (ref 70–99)
Potassium: 4.2 mEq/L (ref 3.5–5.1)
Sodium: 136 mEq/L (ref 135–145)
Total Bilirubin: 0.5 mg/dL (ref 0.2–1.2)
Total Protein: 7.6 g/dL (ref 6.0–8.3)

## 2020-08-13 LAB — T4, FREE: Free T4: 1.01 ng/dL (ref 0.60–1.60)

## 2020-08-13 LAB — TSH: TSH: 1.47 u[IU]/mL (ref 0.35–4.50)
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
## 2020-08-13 NOTE — Patient Instructions (Signed)
Preventing Vitamin D Deficiency Vitamin D is a nutrient that helps your body absorb calcium from food. It plays a key role in the health of bones and teeth, muscle function, and infection prevention. Our bodies make vitamin D when our skin is exposed to direct sunlight. However, for many people, this may not be enough vitamin D to meet the body's needs. When you get too little vitamin D, it is called a deficiency. How can this condition affect me? A vitamin D deficiency can put you at risk of developing conditions that cause bones to be brittle, such as rickets or osteoporosis. If you are over age 51, not having enough vitamin D may weaken your muscles and bones and increase your risk for falls and broken bones. What can increase my risk? You may be at risk for a vitamin D deficiency if you:  Are pregnant.  Are obese.  Are over 36 years old.  Have dark skin.  Take certain medicines that affect the way vitamin D is absorbed.  Have had gastric bypass surgery. Other risk factors include:  Having a condition that limits your ability to absorb fat, such as cystic fibrosis, celiac disease, or inflammatory bowel disease.  Having certain inherited conditions.  Not having access to foods rich in vitamin D.  Having limited ability to move.  Living in areas that have fewer hours of sunlight.  Spending most of your day indoors, or you cover your skin all the time when you are outdoors. Breastfed infants are also at risk for vitamin D deficiency. What actions can I take to reduce my risk of a vitamin D deficiency? Knowing the best sources of vitamin D You can meet your daily vitamin D needs from:  Foods.  Dietary supplements.  Direct exposure to natural sunlight.  Infant formula (for babies). Knowing how much vitamin D you need General recommendations for daily vitamin D intake vary by these categories:  Infants: 400 International Units.  Children over 46 year old: 600  International Units.  Adults: 600 International Units.  Pregnant and breastfeeding women: 600 International Units.  Adults over 9 years old: 80 International Units. These are minimum levels of recommended amounts. Your health care provider may recommend a different amount of vitamin D intake based on your specific needs and your overall health. Getting sun exposure  Get regular, safe exposure to natural sunlight. Expose your skin to direct sunlight for at least 15 minutes every day. If you have dark skin, you may need to expose your skin for a longer period of time.  Protect your skin from too much sun exposure. This helps to prevent skin cancer.  Ask your health care provider if regular sun exposure is safe for you.  Do not use a tanning bed. Eating and drinking   Eat foods that naturally contain vitamin D. These include: ? Beef liver. ? Egg yolk. ? Fatty fish, such as cod, salmon, trout, swordfish, shrimp, sardines, and tuna. ? Cheese. ? Mushrooms. ? Oysters.  Eat or drink products that have been fortified with vitamin D. Fortified means that vitamin D has been added to the food. These may include: ? Cereals. ? Dairy products, such as milk, yogurt, butter, or margarine. ? Orange juice. ? Alternative milks, such as soy milk or almond milk.  When choosing foods, check the food label on the package to see: ? How much vitamin D is in the item. ? If the food is fortified with vitamin D.  Although it is hard  to get your vitamin D requirement from foods alone, you should eat a balanced diet each day that includes foods naturally higher in vitamin D or fortified with it. Try to include the following in your diet each day: ? 2-3 servings of meat or meat alternatives. ? 2-3 servings of dairy. Taking supplements If you are at risk for vitamin D deficiency, or if you have certain diseases, your health care provider may recommend that you take a vitamin D supplement. Make sure  you:  Talk with your health care provider before you start taking any vitamin D supplements. You may be more sensitive to the side effects of vitamin D supplements if you are on certain medicines or have certain medical conditions.  Tell your health care provider about all medicines you are taking, including vitamin, mineral, and herbal supplements.  Take medicines and supplements only as told by your health care provider. Summary  Vitamin D is a nutrient that helps your body absorb calcium from food.  A vitamin D deficiency can put you at risk of developing conditions that cause bones to be brittle, such as rickets or osteoporosis.  Our bodies make vitamin D when our skin is exposed to direct sunlight. However, for many people, this may not be enough vitamin D to meet the body's needs.  Some foods naturally contain vitamin D, including beef liver, egg yolk, and fatty fish.  Products may also be fortified with vitamin D. Fortified means that vitamin D has been added to the food. This information is not intended to replace advice given to you by your health care provider. Make sure you discuss any questions you have with your health care provider. Document Revised: 04/25/2019 Document Reviewed: 07/28/2018 Elsevier Patient Education  2020 [place].  Vitamin D Deficiency Vitamin D deficiency is when your body does not have enough vitamin D. Vitamin D is important to your body for many reasons:  It helps the body absorb two important minerals--calcium and phosphorus.  It plays a role in bone health.  It may help to prevent some diseases, such as diabetes and multiple sclerosis.  It plays a role in muscle function, including heart function. If vitamin D deficiency is severe, it can cause a condition in which your bones become soft. In adults, this condition is called osteomalacia. In children, this condition is called rickets. What are the causes? This condition may be caused  by:  Not eating enough foods that contain vitamin D.  Not getting enough natural sun exposure.  Having certain digestive system diseases that make it difficult for your body to absorb vitamin D. These diseases include Crohn's disease, chronic pancreatitis, and cystic fibrosis.  Having a surgery in which a part of the stomach or a part of the small intestine is removed.  Having chronic kidney disease or liver disease. What increases the risk? You are more likely to develop this condition if you:  Are older.  Do not spend much time outdoors.  Live in a long-term care facility.  Have had broken bones.  Have weak or thin bones (osteoporosis).  Have a disease or condition that changes how the body absorbs vitamin D.  Have dark skin.  Take certain medicines, such as steroid medicines or certain seizure medicines.  Are overweight or obese. What are the signs or symptoms? In mild cases of vitamin D deficiency, there may not be any symptoms. If the condition is severe, symptoms may include:  Bone pain.  Muscle pain.  Falling often.  Broken bones caused by a minor injury. How is this diagnosed? This condition may be diagnosed with blood tests. Imaging tests such as X-rays may also be done to look for changes in the bone. How is this treated? Treatment for this condition may depend on what caused the condition. Treatment options include:  Taking vitamin D supplements. Your health care provider will suggest what dose is best for you.  Taking a calcium supplement. Your health care provider will suggest what dose is best for you. Follow these instructions at home: Eating and drinking   Eat foods that contain vitamin D. Choices include: ? Fortified dairy products, cereals, or juices. Fortified means that vitamin D has been added to the food. Check the label on the package to see if the food is fortified. ? Fatty fish, such as salmon or  trout. ? Eggs. ? Oysters. ? Mushrooms. The items listed above may not be a complete list of recommended foods and beverages. Contact a dietitian for more information. General instructions  Take medicines and supplements only as told by your health care provider.  Get regular, safe exposure to natural sunlight.  Do not use a tanning bed.  Maintain a healthy weight. Lose weight if needed.  Keep all follow-up visits as told by your health care provider. This is important. How is this prevented? You can get vitamin D by:  Eating foods that naturally contain vitamin D.  Eating or drinking products that have been fortified with vitamin D, such as cereals, juices, and dairy products (including milk).  Taking a vitamin D supplement or a multivitamin supplement that contains vitamin D.  Being in the sun. Your body naturally makes vitamin D when your skin is exposed to sunlight. Your body changes the sunlight into a form of the vitamin that it can use. Contact a health care provider if:  Your symptoms do not go away.  You feel nauseous or you vomit.  You have fewer bowel movements than usual or are constipated. Summary  Vitamin D deficiency is when your body does not have enough vitamin D.  Vitamin D is important to your body for good bone health and muscle function, and it may help prevent some diseases.  Vitamin D deficiency is primarily treated through supplementation. Your health care provider will suggest what dose is best for you.  You can get vitamin D by eating foods that contain vitamin D, by being in the sun, and by taking a vitamin D supplement or a multivitamin supplement that contains vitamin D. This information is not intended to replace advice given to you by your health care provider. Make sure you discuss any questions you have with your health care provider. Document Revised: 04/10/2018 Document Reviewed: 04/10/2018 Elsevier Patient Education  [place].  Hypothyroidism  Hypothyroidism is when the thyroid gland does not make enough of certain hormones (it is underactive). The thyroid gland is a small gland located in the lower front part of the neck, just in front of the windpipe (trachea). This gland makes hormones that help control how the body uses food for energy (metabolism) as well as how the heart and brain function. These hormones also play a role in keeping your bones strong. When the thyroid is underactive, it produces too little of the hormones thyroxine (T4) and triiodothyronine (T3). What are the causes? This condition may be caused by:  Hashimoto's disease. This is a disease in which the body's disease-fighting system (immune system) attacks the thyroid gland.  This is the most common cause.  Viral infections.  Pregnancy.  Certain medicines.  Birth defects.  Past radiation treatments to the head or neck for cancer.  Past treatment with radioactive iodine.  Past exposure to radiation in the environment.  Past surgical removal of part or all of the thyroid.  Problems with a gland in the center of the brain (pituitary gland).  Lack of enough iodine in the diet. What increases the risk? You are more likely to develop this condition if:  You are female.  You have a family history of thyroid conditions.  You use a medicine called lithium.  You take medicines that affect the immune system (immunosuppressants). What are the signs or symptoms? Symptoms of this condition include:  Feeling as though you have no energy (lethargy).  Not being able to tolerate cold.  Weight gain that is not explained by a change in diet or exercise habits.  Lack of appetite.  Dry skin.  Coarse hair.  Menstrual irregularity.  Slowing of thought processes.  Constipation.  Sadness or depression. How is this diagnosed? This condition may be diagnosed based on:  Your symptoms, your medical history, and a physical  exam.  Blood tests. You may also have imaging tests, such as an ultrasound or MRI. How is this treated? This condition is treated with medicine that replaces the thyroid hormones that your body does not make. After you begin treatment, it may take several weeks for symptoms to go away. Follow these instructions at home:  Take over-the-counter and prescription medicines only as told by your health care provider.  If you start taking any new medicines, tell your health care provider.  Keep all follow-up visits as told by your health care provider. This is important. ? As your condition improves, your dosage of thyroid hormone medicine may change. ? You will need to have blood tests regularly so that your health care provider can monitor your condition. Contact a health care provider if:  Your symptoms do not get better with treatment.  You are taking thyroid replacement medicine and you: ? Sweat a lot. ? Have tremors. ? Feel anxious. ? Lose weight rapidly. ? Cannot tolerate heat. ? Have emotional swings. ? Have diarrhea. ? Feel weak. Get help right away if you have:  Chest pain.  An irregular heartbeat.  A rapid heartbeat.  Difficulty breathing. Summary  Hypothyroidism is when the thyroid gland does not make enough of certain hormones (it is underactive).  When the thyroid is underactive, it produces too little of the hormones thyroxine (T4) and triiodothyronine (T3).  The most common cause is Hashimoto's disease, a disease in which the body's disease-fighting system (immune system) attacks the thyroid gland. The condition can also be caused by viral infections, medicine, pregnancy, or past radiation treatment to the head or neck.  Symptoms may include weight gain, dry skin, constipation, feeling as though you do not have energy, and not being able to tolerate cold.  This condition is treated with medicine to replace the thyroid hormones that your body does not  make. This information is not intended to replace advice given to you by your health care provider. Make sure you discuss any questions you have with your health care provider. Document Revised: 07/15/2017 Document Reviewed: 07/13/2017 Elsevier Patient Education  2020 [employer].

## 2020-08-16 HISTORY — PX: MYOMECTOMY: SHX85

## 2020-09-01 ENCOUNTER — Encounter: Payer: Self-pay | Admitting: Family Medicine

## 2020-09-05 DIAGNOSIS — N801 Endometriosis of ovary: Secondary | ICD-10-CM | POA: Diagnosis not present

## 2020-09-05 DIAGNOSIS — Z124 Encounter for screening for malignant neoplasm of cervix: Secondary | ICD-10-CM | POA: Diagnosis not present

## 2020-09-05 DIAGNOSIS — D259 Leiomyoma of uterus, unspecified: Secondary | ICD-10-CM | POA: Diagnosis not present

## 2020-09-05 DIAGNOSIS — Z01419 Encounter for gynecological examination (general) (routine) without abnormal findings: Secondary | ICD-10-CM | POA: Diagnosis not present

## 2020-09-12 ENCOUNTER — Ambulatory Visit (HOSPITAL_COMMUNITY)
Admission: EM | Admit: 2020-09-12 | Discharge: 2020-09-12 | Disposition: A | Discharge status: Home / Self Care | Payer: No Typology Code available for payment source

## 2020-09-12 ENCOUNTER — Other Ambulatory Visit: Payer: Self-pay

## 2020-09-12 DIAGNOSIS — E559 Vitamin D deficiency, unspecified: Secondary | ICD-10-CM | POA: Diagnosis not present

## 2020-10-10 ENCOUNTER — Ambulatory Visit: Payer: 59 | Attending: Internal Medicine

## 2020-10-10 ENCOUNTER — Ambulatory Visit: Payer: 59

## 2020-10-10 DIAGNOSIS — Z23 Encounter for immunization: Secondary | ICD-10-CM

## 2020-10-10 NOTE — Progress Notes (Signed)
   Covid-19 Vaccination Clinic  Name:  Leonora Gores    MRN: [id number] DOB: 1986-04-02  10/10/2020  Ms. Murphey was observed post Covid-19 immunization for 15 minutes without incident. She was provided with Vaccine Information Sheet and instruction to access the V-Safe system.   Ms. Peltzer was instructed to call 911 with any severe reactions post vaccine: Marland Kitchen Difficulty breathing  . Swelling of face and throat  . A fast heartbeat  . A bad rash all over body  . Dizziness and weakness   Immunizations Administered    Name Date Dose VIS Date Route   PFIZER Comrnaty(Gray TOP) Covid-19 Vaccine 10/10/2020 11:23 AM 0.3 mL 07/24/2020 Intramuscular   Manufacturer: [employer]   Lot: [id number]   NDC: [phone]

## 2020-10-13 ENCOUNTER — Other Ambulatory Visit (HOSPITAL_COMMUNITY): Payer: Self-pay | Admitting: Internal Medicine

## 2020-10-13 MED FILL — PFIZER-BIONT COVID-19 VAC-T: 30 | 21 days supply | Qty: 0 | Fill #0

## 2020-10-20 ENCOUNTER — Encounter: Payer: Self-pay | Admitting: Family Medicine

## 2020-10-28 ENCOUNTER — Other Ambulatory Visit: Payer: Self-pay

## 2020-10-28 ENCOUNTER — Emergency Department (HOSPITAL_BASED_OUTPATIENT_CLINIC_OR_DEPARTMENT_OTHER)
Admission: EM | Admit: 2020-10-28 | Discharge: 2020-10-28 | Disposition: A | Discharge status: Home / Self Care | Payer: 59 | Attending: Emergency Medicine | Admitting: Emergency Medicine

## 2020-10-28 ENCOUNTER — Encounter (HOSPITAL_BASED_OUTPATIENT_CLINIC_OR_DEPARTMENT_OTHER): Payer: Self-pay

## 2020-10-28 DIAGNOSIS — K644 Residual hemorrhoidal skin tags: Secondary | ICD-10-CM | POA: Diagnosis not present

## 2020-10-28 DIAGNOSIS — K649 Unspecified hemorrhoids: Secondary | ICD-10-CM | POA: Diagnosis not present

## 2020-10-28 LAB — URINALYSIS, ROUTINE W REFLEX MICROSCOPIC
Bilirubin Urine: NEGATIVE
Glucose, UA: NEGATIVE mg/dL
Ketones, ur: NEGATIVE mg/dL
Leukocytes,Ua: NEGATIVE
Nitrite: NEGATIVE
Protein, ur: NEGATIVE mg/dL
Specific Gravity, Urine: 1.01 (ref 1.005–1.030)
pH: 6 (ref 5.0–8.0)

## 2020-10-28 LAB — PREGNANCY, URINE: Preg Test, Ur: NEGATIVE

## 2020-10-28 LAB — URINALYSIS, MICROSCOPIC (REFLEX): WBC, UA: NONE SEEN WBC/hpf (ref 0–5)
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
## 2020-10-28 NOTE — ED Triage Notes (Addendum)
Pt states she used the restroom and wiped and saw some spots of blood on the toilet paper. Unsure if it was in stool or urine. Wiped again and no blood seen. Denies blood on underwear. Hx of hemmorhoids

## 2020-10-28 NOTE — Discharge Instructions (Addendum)
Recommend MiraLAX.
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
## 2020-12-12 DIAGNOSIS — L700 Acne vulgaris: Secondary | ICD-10-CM | POA: Diagnosis not present

## 2020-12-24 ENCOUNTER — Ambulatory Visit (HOSPITAL_COMMUNITY)
Admission: EM | Admit: 2020-12-24 | Discharge: 2020-12-24 | Disposition: A | Discharge status: Home / Self Care | Payer: 59 | Attending: Medical Oncology | Admitting: Medical Oncology

## 2020-12-24 ENCOUNTER — Encounter (HOSPITAL_COMMUNITY): Payer: Self-pay | Admitting: *Deleted

## 2020-12-24 ENCOUNTER — Other Ambulatory Visit: Payer: Self-pay

## 2020-12-24 DIAGNOSIS — M79605 Pain in left leg: Secondary | ICD-10-CM

## 2020-12-24 MED ORDER — MELOXICAM 15 MG PO TABS: 15.0000 mg | ORAL_TABLET | Freq: Every day | ORAL | 0 refills | Status: DC

## 2020-12-24 NOTE — ED Triage Notes (Signed)
Pt reports pain to heel area and burning sensation to Lt lower leg after walking in park yesterday.
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
## 2021-04-02 ENCOUNTER — Other Ambulatory Visit: Payer: Self-pay

## 2021-04-02 ENCOUNTER — Encounter (HOSPITAL_BASED_OUTPATIENT_CLINIC_OR_DEPARTMENT_OTHER): Payer: Self-pay | Admitting: Obstetrics and Gynecology

## 2021-04-02 ENCOUNTER — Emergency Department (HOSPITAL_BASED_OUTPATIENT_CLINIC_OR_DEPARTMENT_OTHER)
Admission: EM | Admit: 2021-04-02 | Discharge: 2021-04-02 | Disposition: A | Discharge status: Home / Self Care | Payer: 59 | Attending: Emergency Medicine | Admitting: Emergency Medicine

## 2021-04-02 DIAGNOSIS — M79602 Pain in left arm: Secondary | ICD-10-CM

## 2021-04-02 MED ORDER — MELOXICAM 15 MG PO TABS
15.0000 mg | ORAL_TABLET | Freq: Every day | ORAL | 0 refills | Status: DC | PRN
Start: 2021-04-02 — End: 2021-10-05
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
## 2021-04-02 NOTE — ED Triage Notes (Signed)
Patient reports a burning sensation from the back middle of her back that radiates to the left shoulder and down the back of the arm. Patient reports she has had similar sensations in her legs before. Patient denies recent injury. Patient denies being diabetic.

## 2021-04-02 NOTE — Discharge Instructions (Addendum)
You were seen in the ED with burning pain to the back of the left arm. I am prescribing an antiinflammatory medication. Please take as prescribed. If you develop a rash in the area where you are having pain this may require additional treatment and you should be seen by a doctor for additional medication. Please follow closely with your PCP in the coming 1-2 weeks.

## 2021-04-13 ENCOUNTER — Encounter: Payer: Self-pay | Admitting: Family Medicine

## 2021-04-13 ENCOUNTER — Other Ambulatory Visit: Payer: Self-pay

## 2021-04-13 ENCOUNTER — Ambulatory Visit: Payer: 59 | Admitting: Family Medicine

## 2021-04-13 ENCOUNTER — Other Ambulatory Visit (HOSPITAL_COMMUNITY)
Admission: RE | Admit: 2021-04-13 | Discharge: 2021-04-13 | Disposition: A | Discharge status: Home / Self Care | Payer: 59 | Source: Ambulatory Visit | Attending: Family Medicine | Admitting: Family Medicine

## 2021-04-13 VITALS — BP 130/84 | HR 94 | Temp 100.1°F | Wt 155.6 lb

## 2021-04-13 DIAGNOSIS — J302 Other seasonal allergic rhinitis: Secondary | ICD-10-CM | POA: Diagnosis not present

## 2021-04-13 DIAGNOSIS — N760 Acute vaginitis: Secondary | ICD-10-CM | POA: Insufficient documentation

## 2021-04-13 DIAGNOSIS — Z113 Encounter for screening for infections with a predominantly sexual mode of transmission: Secondary | ICD-10-CM | POA: Diagnosis not present

## 2021-04-13 DIAGNOSIS — E559 Vitamin D deficiency, unspecified: Secondary | ICD-10-CM

## 2021-04-13 DIAGNOSIS — R202 Paresthesia of skin: Secondary | ICD-10-CM | POA: Diagnosis not present

## 2021-04-13 DIAGNOSIS — S39012D Strain of muscle, fascia and tendon of lower back, subsequent encounter: Secondary | ICD-10-CM

## 2021-04-13 LAB — CBC WITH DIFFERENTIAL/PLATELET
Basophils Absolute: 0 10*3/uL (ref 0.0–0.1)
Basophils Relative: 0.6 % (ref 0.0–3.0)
Eosinophils Absolute: 0 10*3/uL (ref 0.0–0.7)
Eosinophils Relative: 0.9 % (ref 0.0–5.0)
HCT: 42.5 % (ref 36.0–46.0)
Hemoglobin: 13.8 g/dL (ref 12.0–15.0)
Lymphocytes Relative: 24 % (ref 12.0–46.0)
Lymphs Abs: 1.1 10*3/uL (ref 0.7–4.0)
MCHC: 32.5 g/dL (ref 30.0–36.0)
MCV: 89.1 fl (ref 78.0–100.0)
Monocytes Absolute: 0.2 10*3/uL (ref 0.1–1.0)
Monocytes Relative: 4.8 % (ref 3.0–12.0)
Neutro Abs: 3.2 10*3/uL (ref 1.4–7.7)
Neutrophils Relative %: 69.7 % (ref 43.0–77.0)
Platelets: 308 10*3/uL (ref 150.0–400.0)
RBC: 4.77 Mil/uL (ref 3.87–5.11)
RDW: 14.5 % (ref 11.5–15.5)
WBC: 4.7 10*3/uL (ref 4.0–10.5)

## 2021-04-13 LAB — VITAMIN D 25 HYDROXY (VIT D DEFICIENCY, FRACTURES): VITD: 37.16 ng/mL (ref 30.00–100.00)

## 2021-04-13 LAB — VITAMIN B12: Vitamin B-12: 342 pg/mL (ref 211–911)

## 2021-04-13 NOTE — Progress Notes (Signed)
Subjective:    Patient ID: Jessica Villarreal, female    DOB: 1986/07/20, 35 y.o.   MRN: [id number]  Chief Complaint  Patient presents with   Back Pain    Had a burning sensation in back but is now gone but now has a pain in mid back. States when she stretches forward, she does not feel the pain     HPI Patient was seen today for f/u on several concerns.  PT seen in ED on 8/18 for burning pain in back and LUE.  Given rx for mobic.  Pt recalls lifting a heavy headboard days prior to symptoms starting.  Discomfort in back and arm have resolved since Thursday.  Pt endorses increased sinus issues.  Has to take a decongestant to relieve discomfort in sinuses.  Pt has not tried an antihistamine.  Pt inquires about Vitamin D level.  In the past was low, then returned to normal.  Pt avoids extra sun exposure.  Pt endorses mild vaginal irritation and d/c x 1 wk.  Noted after ovulation as a white d/c without odor.   Past Medical History:  Diagnosis Date   Allergy    GERD (gastroesophageal reflux disease)    Hay fever    Ovarian cyst    Yeast infection     Allergies  Allergen Reactions   Doxycycline Other (See Comments)    Stomach aches.  Stomach aches.     ROS General: Denies fever, chills, night sweats, changes in weight, changes in appetite HEENT: Denies headaches, ear pain, changes in vision, rhinorrhea, sore throat +sinus issues and throat irritation CV: Denies CP, palpitations, SOB, orthopnea Pulm: Denies SOB, cough, wheezing GI: Denies abdominal pain, nausea, vomiting, diarrhea, constipation GU: Denies dysuria, hematuria, frequency  +vaginal discharge, vaginal irritation Msk: Denies muscle cramps, joint pains  Neuro: Denies weakness, numbness, tingling +burning sensation in back and LUE Skin: Denies rashes, bruising Psych: Denies depression, anxiety, hallucinations     Objective:    Blood pressure 130/84, pulse 94, temperature 100.1 F (37.8 C), temperature source Oral,  weight 155 lb 9.6 oz (70.6 kg), last menstrual period 03/26/2021, SpO2 99 %.   Gen. Pleasant, well-nourished, in no distress, normal affect   HEENT: [place]/AT, face symmetric, conjunctiva clear, no scleral icterus, PERRLA, EOMI, nares patent without drainage Lungs: no accessory muscle use Cardiovascular: RRR, no peripheral edema GU: aptima self swab collected Musculoskeletal:  No TTP of cervical, thoracic, lumbar spine, or paraspinal muscles.  No LE weakness.  No deformities, no cyanosis or clubbing, normal tone Neuro:  A&Ox3, CN II-XII intact, normal gait Skin:  Warm, no lesions/ rash   Wt Readings from Last 3 Encounters:  04/13/21 155 lb 9.6 oz (70.6 kg)  04/02/21 155 lb (70.3 kg)  10/28/20 144 lb (65.3 kg)    Lab Results  Component Value Date   WBC 5.3 08/13/2020   HGB 13.0 08/13/2020   HCT 39.1 08/13/2020   PLT 319.0 08/13/2020   GLUCOSE 75 08/13/2020   CHOL 179 08/09/2019   TRIG 41.0 08/09/2019   HDL 55.20 08/09/2019   LDLCALC 116 (H) 08/09/2019   ALT 9 08/13/2020   AST 14 08/13/2020   NA 136 08/13/2020   K 4.2 08/13/2020   CL 101 08/13/2020   CREATININE 0.82 08/13/2020   BUN 10 08/13/2020   CO2 27 08/13/2020   TSH 1.47 08/13/2020   HGBA1C 5.3 08/09/2019    Assessment/Plan:  Seasonal allergies -Discussed OTC antihistamine such as Zyrtec, Claritin, Allegra, etc. -Can also use  saline nasal rinse or Flonase  Strain of lumbar region, subsequent encounter -resolved -continue supportive care prn including ice, heat, stretching, NSAIDs or Tylenol as needed  Acute vaginitis  -Aptima self swab obtained. - Plan: Cervicovaginal ancillary only  Routine screening for STI (sexually transmitted infection)  - Plan: HIV Antibody (routine testing w rflx), RPR  Paresthesia  - Plan: Vitamin B12, CBC with Differential/Platelet  Vitamin D deficiency  - Plan: Vitamin D, 25-hydroxy  F/u prn  Grier Mitts, MD

## 2021-04-14 LAB — HIV ANTIBODY (ROUTINE TESTING W REFLEX): HIV 1&2 Ab, 4th Generation: NONREACTIVE

## 2021-04-14 LAB — RPR: RPR Ser Ql: NONREACTIVE

## 2021-04-15 LAB — CERVICOVAGINAL ANCILLARY ONLY
Bacterial Vaginitis (gardnerella): NEGATIVE
Candida Glabrata: NEGATIVE
Candida Vaginitis: NEGATIVE
Chlamydia: NEGATIVE
Comment: NEGATIVE
Comment: NEGATIVE
Comment: NEGATIVE
Comment: NEGATIVE
Comment: NEGATIVE
Comment: NORMAL
Neisseria Gonorrhea: NEGATIVE
Trichomonas: NEGATIVE

## 2021-06-19 ENCOUNTER — Other Ambulatory Visit: Payer: Self-pay

## 2021-08-07 IMAGING — DX DG CHEST 2V
2 series · 2 of 2 positions shown · non-contrast
Comparison: 02/06/2019

CLINICAL DATA: Substernal chest pain

EXAM:
CHEST - 2 VIEW

[chest pa]
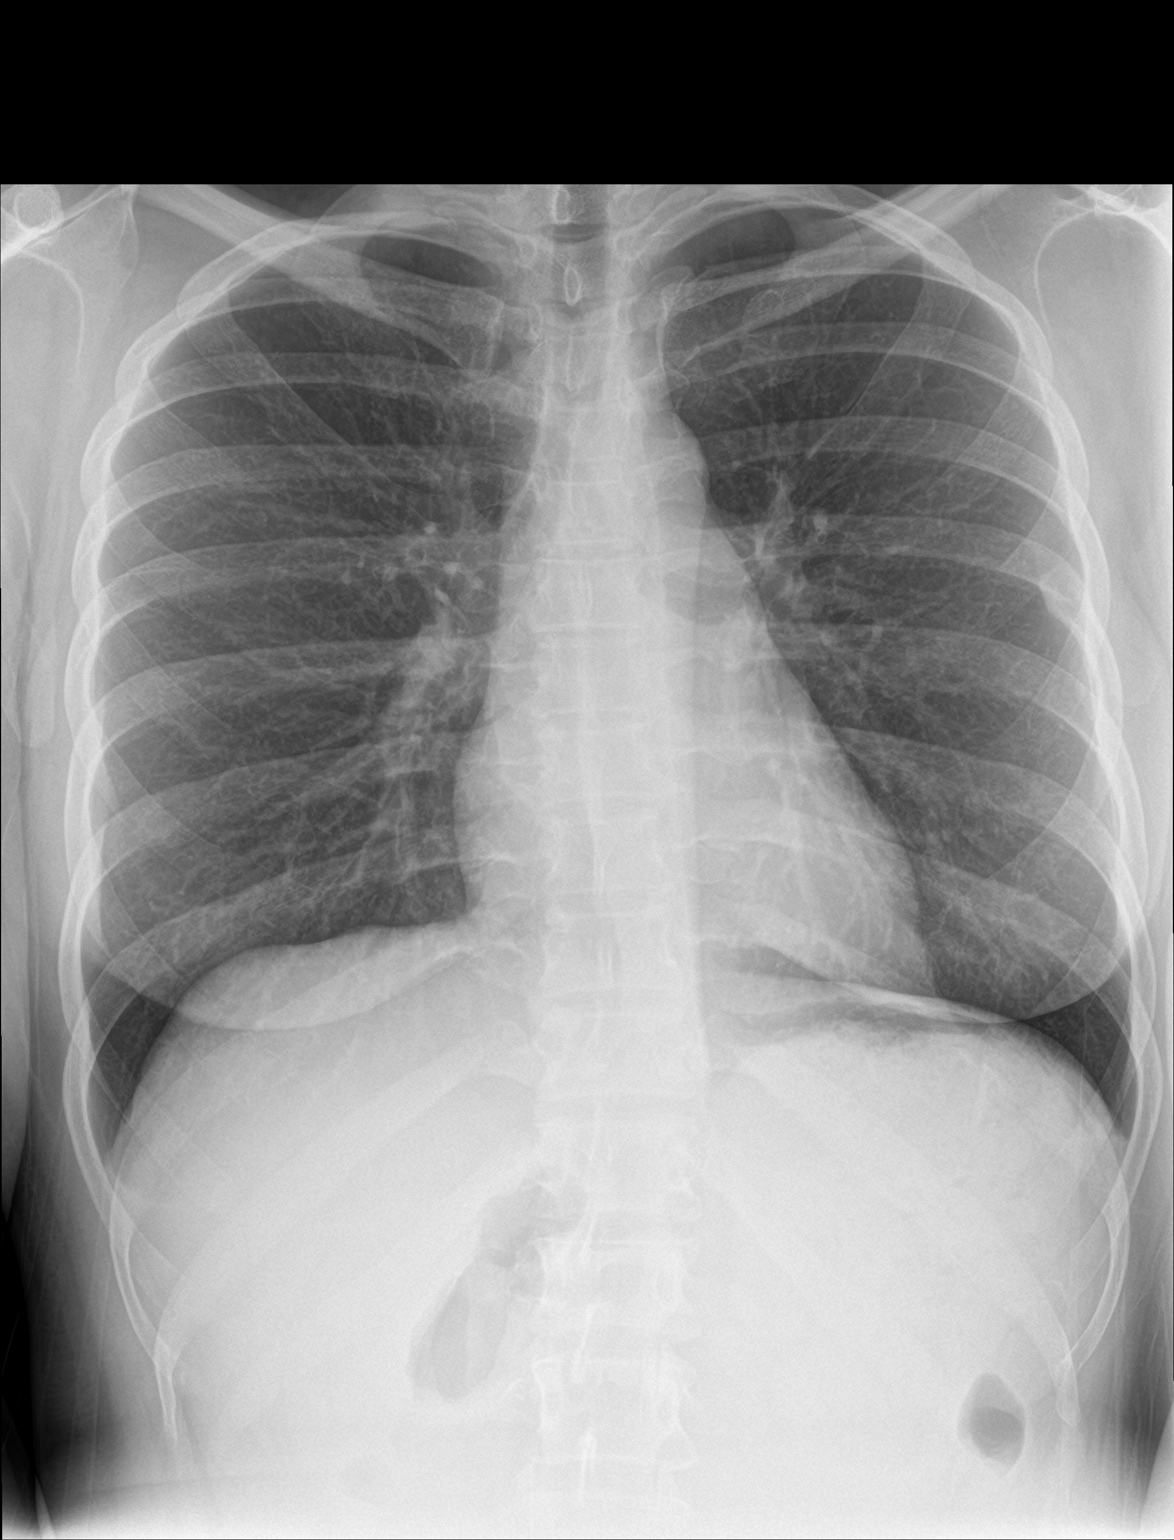

[chest lat]
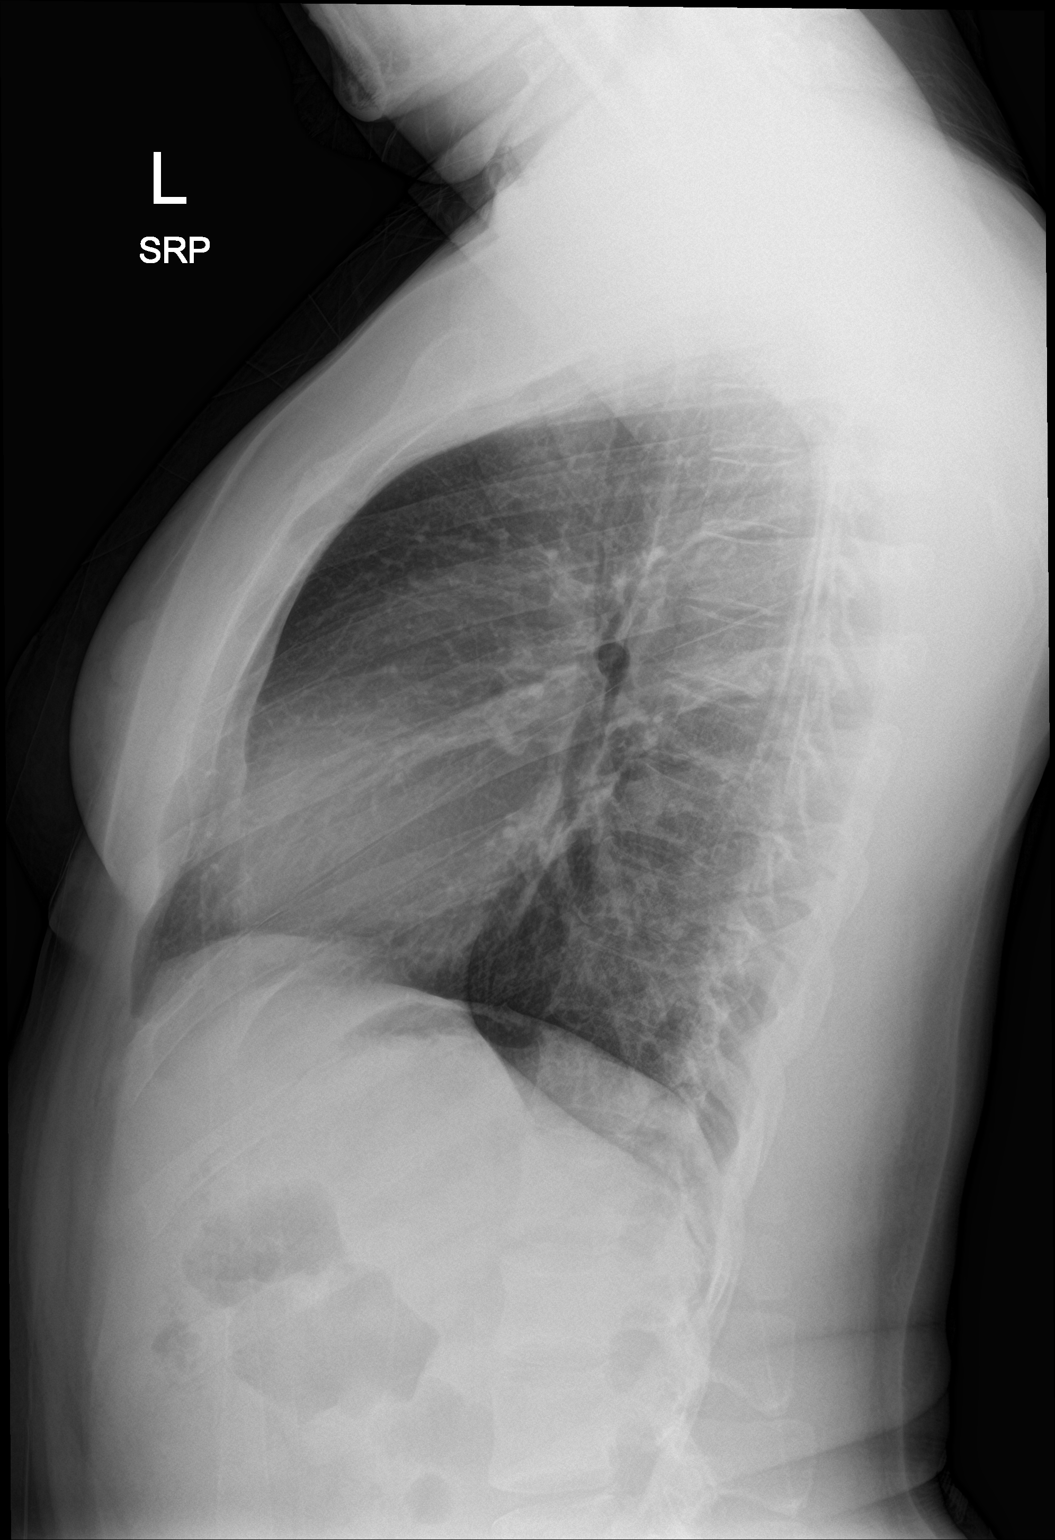

[2 of 2 positions shown; findings below may reference images not displayed]

FINDINGS: The heart size and mediastinal contours are within normal limits.
Both lungs are clear. The visualized skeletal structures are
unremarkable.
IMPRESSION: No active cardiopulmonary disease.

## 2021-08-11 ENCOUNTER — Encounter (HOSPITAL_BASED_OUTPATIENT_CLINIC_OR_DEPARTMENT_OTHER): Payer: Self-pay | Admitting: Emergency Medicine

## 2021-08-11 ENCOUNTER — Other Ambulatory Visit: Payer: Self-pay

## 2021-08-11 ENCOUNTER — Emergency Department (HOSPITAL_BASED_OUTPATIENT_CLINIC_OR_DEPARTMENT_OTHER)
Admission: EM | Admit: 2021-08-11 | Discharge: 2021-08-11 | Disposition: A | Discharge status: Home / Self Care | Payer: 59 | Attending: Emergency Medicine | Admitting: Emergency Medicine

## 2021-08-11 DIAGNOSIS — U071 COVID-19: Secondary | ICD-10-CM | POA: Diagnosis not present

## 2021-08-11 DIAGNOSIS — R059 Cough, unspecified: Secondary | ICD-10-CM | POA: Diagnosis present

## 2021-08-11 LAB — RESP PANEL BY RT-PCR (FLU A&B, COVID) ARPGX2
Influenza A by PCR: NEGATIVE
Influenza B by PCR: NEGATIVE
SARS Coronavirus 2 by RT PCR: POSITIVE — AB

## 2021-08-11 MED ORDER — NAPROXEN 500 MG PO TABS: 500.0000 mg | ORAL_TABLET | Freq: Two times a day (BID) | ORAL | 0 refills | Status: DC

## 2021-08-11 MED ORDER — NIRMATRELVIR/RITONAVIR (PAXLOVID)TABLET: 3.0000 | ORAL_TABLET | Freq: Two times a day (BID) | ORAL | 0 refills | Status: AC
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
## 2021-08-11 NOTE — ED Triage Notes (Signed)
Fever today , throat discomfort , no cough .

## 2021-08-13 ENCOUNTER — Telehealth: Payer: Self-pay | Admitting: Family Medicine

## 2021-08-13 NOTE — Telephone Encounter (Signed)
Patient called regarding questions about her prescription for Paxlovid, she wanted to know if she could take Theraflu instead. Transferred to triage because CMA and provider were unavailable

## 2021-10-05 ENCOUNTER — Encounter: Payer: Self-pay | Admitting: Family Medicine

## 2021-10-05 ENCOUNTER — Ambulatory Visit (INDEPENDENT_AMBULATORY_CARE_PROVIDER_SITE_OTHER): Payer: 59 | Admitting: Family Medicine

## 2021-10-05 VITALS — BP 118/72 | HR 107 | Temp 99.5°F | Resp 16 | Ht 61.0 in | Wt 147.6 lb

## 2021-10-05 DIAGNOSIS — Z8781 Personal history of (healed) traumatic fracture: Secondary | ICD-10-CM | POA: Diagnosis not present

## 2021-10-05 DIAGNOSIS — M545 Low back pain, unspecified: Secondary | ICD-10-CM

## 2021-10-05 DIAGNOSIS — M62838 Other muscle spasm: Secondary | ICD-10-CM | POA: Diagnosis not present

## 2021-10-05 DIAGNOSIS — R102 Pelvic and perineal pain: Secondary | ICD-10-CM | POA: Diagnosis not present

## 2021-10-05 MED ORDER — CYCLOBENZAPRINE HCL 5 MG PO TABS: 5.0000 mg | ORAL_TABLET | Freq: Every day | ORAL | 0 refills | Status: DC
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
## 2021-10-19 ENCOUNTER — Other Ambulatory Visit (HOSPITAL_COMMUNITY): Payer: Self-pay

## 2021-11-04 ENCOUNTER — Ambulatory Visit: Payer: Self-pay | Admitting: Physical Therapy

## 2021-11-17 ENCOUNTER — Ambulatory Visit: Payer: 59 | Admitting: Physical Therapy
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
## 2021-12-14 ENCOUNTER — Encounter: Payer: Self-pay | Admitting: Physical Therapy

## 2021-12-14 ENCOUNTER — Ambulatory Visit: Payer: 59 | Attending: Family Medicine | Admitting: Physical Therapy

## 2021-12-14 DIAGNOSIS — R102 Pelvic and perineal pain: Secondary | ICD-10-CM | POA: Diagnosis not present

## 2021-12-14 DIAGNOSIS — Z8781 Personal history of (healed) traumatic fracture: Secondary | ICD-10-CM | POA: Insufficient documentation

## 2021-12-14 DIAGNOSIS — M545 Low back pain, unspecified: Secondary | ICD-10-CM | POA: Diagnosis present

## 2021-12-14 DIAGNOSIS — M62838 Other muscle spasm: Secondary | ICD-10-CM | POA: Diagnosis not present

## 2021-12-14 DIAGNOSIS — M6281 Muscle weakness (generalized): Secondary | ICD-10-CM | POA: Insufficient documentation

## 2021-12-25 ENCOUNTER — Encounter: Payer: Self-pay | Admitting: Physical Therapy

## 2021-12-25 ENCOUNTER — Ambulatory Visit: Payer: 59 | Admitting: Physical Therapy

## 2021-12-25 DIAGNOSIS — M545 Low back pain, unspecified: Secondary | ICD-10-CM | POA: Diagnosis not present

## 2021-12-25 DIAGNOSIS — M62838 Other muscle spasm: Secondary | ICD-10-CM

## 2021-12-25 DIAGNOSIS — M6281 Muscle weakness (generalized): Secondary | ICD-10-CM
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
## 2021-12-30 ENCOUNTER — Ambulatory Visit: Payer: 59 | Admitting: Physical Therapy

## 2021-12-30 ENCOUNTER — Encounter: Payer: Self-pay | Admitting: Physical Therapy

## 2021-12-30 DIAGNOSIS — M545 Low back pain, unspecified: Secondary | ICD-10-CM | POA: Diagnosis not present

## 2021-12-30 DIAGNOSIS — M6281 Muscle weakness (generalized): Secondary | ICD-10-CM

## 2021-12-30 DIAGNOSIS — M62838 Other muscle spasm: Secondary | ICD-10-CM

## 2022-01-06 ENCOUNTER — Encounter: Payer: 59 | Admitting: Physical Therapy

## 2022-01-18 ENCOUNTER — Ambulatory Visit: Payer: 59 | Attending: Family Medicine | Admitting: Physical Therapy

## 2022-01-18 ENCOUNTER — Telehealth: Payer: Self-pay | Admitting: Physical Therapy

## 2022-01-18 DIAGNOSIS — M62838 Other muscle spasm: Secondary | ICD-10-CM | POA: Insufficient documentation

## 2022-01-18 DIAGNOSIS — M6281 Muscle weakness (generalized): Secondary | ICD-10-CM | POA: Insufficient documentation

## 2022-01-18 NOTE — Telephone Encounter (Signed)
Patient did not show for appointment.  Patient was called and PT left message to please call us back.  Gustavus Bryant, PT 01/18/22 9:58 AM

## 2022-01-25 ENCOUNTER — Ambulatory Visit: Payer: 59 | Admitting: Physical Therapy

## 2022-01-25 ENCOUNTER — Encounter: Payer: Self-pay | Admitting: Physical Therapy

## 2022-01-25 DIAGNOSIS — M6281 Muscle weakness (generalized): Secondary | ICD-10-CM

## 2022-01-25 DIAGNOSIS — M62838 Other muscle spasm: Secondary | ICD-10-CM
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
## 2022-02-01 ENCOUNTER — Ambulatory Visit: Payer: 59 | Admitting: Physical Therapy

## 2022-02-01 DIAGNOSIS — M62838 Other muscle spasm: Secondary | ICD-10-CM | POA: Diagnosis not present

## 2022-02-01 DIAGNOSIS — M6281 Muscle weakness (generalized): Secondary | ICD-10-CM

## 2022-03-03 ENCOUNTER — Encounter (HOSPITAL_COMMUNITY): Payer: Self-pay | Admitting: Emergency Medicine

## 2022-03-03 ENCOUNTER — Ambulatory Visit (HOSPITAL_COMMUNITY)
Admission: EM | Admit: 2022-03-03 | Discharge: 2022-03-03 | Disposition: A | Discharge status: Home / Self Care | Payer: 59 | Attending: Family Medicine | Admitting: Family Medicine

## 2022-03-03 DIAGNOSIS — B349 Viral infection, unspecified: Secondary | ICD-10-CM

## 2022-03-03 NOTE — Discharge Instructions (Addendum)
Follow up with your primary care doctor or here if you are not seeing improvement of your symptoms over the next several days, sooner if you feel you are worsening.  Caring for yourself: Get plenty of rest. Drink plenty of fluids, enough so that your urine is light yellow or clear like water. If you have kidney, heart, or liver disease and have to limit fluids, talk with your doctor before you increase the amount of fluids you drink. Take an over-the-counter pain medicine if needed, such as acetaminophen (Tylenol), ibuprofen (Advil, Motrin), or naproxen (Aleve), to relieve fever, headache, and muscle aches. Read and follow all instructions on the label. No one younger than 20 should take aspirin. It has been linked to Reye syndrome, a serious illness. Before you use over the counter cough and cold medicines, check the label. These medicines may not be safe for children younger than age 43 or for people with certain health problems. If the skin around your nose and lips becomes sore, put some petroleum jelly on the area.  Avoid spreading a virus: Wash your hands regularly, and keep your hands away from your face.  Stay home from school, work, and other public places until you are feeling better and your fever has been gone for at least 24 hours. The fever needs to have gone away on its own without the help of medicine.

## 2022-03-03 NOTE — ED Triage Notes (Signed)
Patient c/o headache, no sore throat, "spit out some mucous x 2" last night and today.  Feels like she's getting sick.  Denies taken any OTC cold meds.  No cough.

## 2022-03-04 NOTE — ED Provider Notes (Signed)
[place]   [id number] 03/03/22 Arrival Time: [place]:  1. Viral illness    Discussed typical duration of viral illnesses. OTC symptom care as needed. Work note provided.   Follow-up Information     [facility] at [facility].   Specialty: Urgent Care Why: If worsening or failing to improve as anticipated. Contact information: [place] [id number] [phone]                Reviewed expectations re: course of current medical issues. Questions answered. Outlined signs and symptoms indicating need for more acute intervention. Understanding verbalized. After Visit Summary given.   SUBJECTIVE: History from: Patient. Jessica Villarreal is a 36 y.o. female. Reports: mild ST. HA, nasal cong. Mild fatigue. Abrupt onset yesterday. Denies: fever and difficulty breathing. Normal PO intake without n/v/d.  OBJECTIVE:  Vitals:   03/03/22 1941 03/03/22 1943  BP: 135/85   Pulse: 91   Resp: 18   Temp: 99.1 F (37.3 C)   TempSrc: Oral   SpO2: 99%   Weight:  66.7 kg  Height:  '5\' 1"'$  (1.549 m)    General appearance: alert; no distress Eyes: PERRLA; EOMI; conjunctiva normal HENT: [place]; AT; with nasal congestion Neck: supple  Lungs: speaks full sentences without difficulty; unlabored Extremities: no edema Skin: warm and dry Neurologic: normal gait Psychological: alert and cooperative; normal mood and affect   Allergies  Allergen Reactions   Doxycycline Other (See Comments)    Stomach aches.  Stomach aches.     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  Tobacco Use   Smoking status: Never    Passive exposure: Never   Smokeless tobacco: Never  Vaping Use   Vaping Use: Never used  Substance and Sexual Activity   Alcohol use: No    Alcohol/week: 1.0 standard drink of alcohol    Types: 1 Glasses of wine per week    Comment: wine socially   Drug use: No   Sexual activity: Yes    Birth control/protection: None  Other Topics Concern   Not on file  Social History Narrative   Not on file   Social Determinants of Health   Financial Resource Strain: Not on file  Food Insecurity: Not on file  Transportation Needs: Not on file  Physical Activity: Not on file  Stress: Not on file  Social Connections: Not on file  Intimate Partner Violence: Not on file   Family History  Problem Relation Age of Onset   Hypertension Mother    Healthy Father    Heart failure Maternal Grandmother    Diabetes Maternal Grandfather    Lymphoma Paternal Grandmother    Colon cancer Neg Hx    Stomach cancer Neg Hx    Rectal cancer Neg Hx    Esophageal cancer Neg Hx    Liver cancer Neg Hx    Past Surgical History:  Procedure Laterality Date   WISDOM TOOTH EXTRACTION       Allyse Fregeau, Aaron Edelman,  MD 03/04/22 0947

## 2022-03-10 ENCOUNTER — Ambulatory Visit (INDEPENDENT_AMBULATORY_CARE_PROVIDER_SITE_OTHER): Payer: 59 | Admitting: Family Medicine

## 2022-03-10 ENCOUNTER — Other Ambulatory Visit: Payer: Self-pay

## 2022-03-10 VITALS — BP 116/80 | HR 100 | Temp 98.6°F | Wt 154.6 lb

## 2022-03-10 DIAGNOSIS — J301 Allergic rhinitis due to pollen: Secondary | ICD-10-CM

## 2022-03-10 MED ORDER — FLUTICASONE PROPIONATE 50 MCG/ACT NA SUSP: 1.0000 | Freq: Every day | NASAL | 3 refills | Status: DC

## 2022-03-10 NOTE — Patient Instructions (Signed)
You can also try local honey, saline nasal rinse, steam from shower to help with your symptoms.

## 2022-03-10 NOTE — Progress Notes (Signed)
Subjective:    Patient ID: Jessica Villarreal, female    DOB: 02-25-1986, 36 y.o.   MRN: [id number]  Chief Complaint  Patient presents with   Sinus Problem    Comes and goes.     Headache    HPI Patient was seen today for acute concern. Pt with facial pressure, headache, nasal congestion, postnasal drainage for the last few days that seems to come and go.  Patient states changes in weather and certain foods can cause increased symptoms.  Pt has not tried anything for sx.  Past Medical History:  Diagnosis Date   Allergy    GERD (gastroesophageal reflux disease)    Hay fever    Ovarian cyst    Yeast infection     Allergies  Allergen Reactions   Doxycycline Other (See Comments)    Stomach aches.  Stomach aches.     ROS General: Denies fever, chills, night sweats, changes in weight, changes in appetite HEENT: Denies ear pain, changes in vision, rhinorrhea, sore throat + nasal congestion, facial pressure, headache, nasal drainage CV: Denies CP, palpitations, SOB, orthopnea Pulm: Denies SOB, cough, wheezing GI: Denies abdominal pain, nausea, vomiting, diarrhea, constipation GU: Denies dysuria, hematuria, frequency, vaginal discharge Msk: Denies muscle cramps, joint pains Neuro: Denies weakness, numbness, tingling Skin: Denies rashes, bruising Psych: Denies depression, anxiety, hallucinations     Objective:    Blood pressure 116/80, pulse 100, temperature 98.6 F (37 C), temperature source Oral, weight 154 lb 9.6 oz (70.1 kg), last menstrual period 02/14/2022, SpO2 98 %.  Gen. Pleasant, well-nourished, in no distress, normal affect   HEENT: [place]/AT, face symmetric, conjunctiva clear, no scleral icterus, PERRLA, EOMI, no TTP of sinuses, nares patent without drainage, boggy, edematous nasal turbinates, pharynx with post nasal drainage, no erythema or exudate.  TMs full b/l. Lungs: no accessory muscle use, CTAB, no wheezes or rales Cardiovascular: RRR, no m/r/g, no peripheral  edema Neuro:  A&Ox3, CN II-XII intact, normal gait Skin:  Warm, no lesions/ rash   Wt Readings from Last 3 Encounters:  03/10/22 154 lb 9.6 oz (70.1 kg)  03/03/22 147 lb (66.7 kg)  10/05/21 147 lb 9.6 oz (67 kg)    Lab Results  Component Value Date   WBC 4.7 04/13/2021   HGB 13.8 04/13/2021   HCT 42.5 04/13/2021   PLT 308.0 04/13/2021   GLUCOSE 75 08/13/2020   CHOL 179 08/09/2019   TRIG 41.0 08/09/2019   HDL 55.20 08/09/2019   LDLCALC 116 (H) 08/09/2019   ALT 9 08/13/2020   AST 14 08/13/2020   NA 136 08/13/2020   K 4.2 08/13/2020   CL 101 08/13/2020   CREATININE 0.82 08/13/2020   BUN 10 08/13/2020   CO2 27 08/13/2020   TSH 1.47 08/13/2020   HGBA1C 5.3 08/09/2019    Assessment/Plan:  Seasonal allergic rhinitis due to pollen  -consider OTC antihistamine such as Zyrtec, Claritin, Allegra, Xyzal -Flonase nasal spray or saline nasal rinse -Also consider local honey, steam from shower -Monitor for continued or worsening symptoms.  Advised symptoms may progress into sinus infection. - Plan: fluticasone (FLONASE) 50 MCG/ACT nasal spray  F/u as needed  Grier Mitts, MD

## 2022-03-21 ENCOUNTER — Encounter: Payer: Self-pay | Admitting: Family Medicine

## 2022-04-12 ENCOUNTER — Encounter (HOSPITAL_BASED_OUTPATIENT_CLINIC_OR_DEPARTMENT_OTHER): Payer: Self-pay

## 2022-04-12 ENCOUNTER — Emergency Department (HOSPITAL_BASED_OUTPATIENT_CLINIC_OR_DEPARTMENT_OTHER): Payer: 59 | Admitting: Radiology

## 2022-04-12 DIAGNOSIS — R002 Palpitations: Secondary | ICD-10-CM | POA: Insufficient documentation

## 2022-04-12 DIAGNOSIS — R0602 Shortness of breath: Secondary | ICD-10-CM | POA: Diagnosis present

## 2022-04-12 LAB — TROPONIN I (HIGH SENSITIVITY): Troponin I (High Sensitivity): 3 ng/L (ref <18)

## 2022-04-12 LAB — CBC WITH DIFFERENTIAL/PLATELET
Abs Immature Granulocytes: 0.01 10*3/uL (ref 0.00–0.07)
Basophils Absolute: 0 10*3/uL (ref 0.0–0.1)
Basophils Relative: 1 %
Eosinophils Absolute: 0.1 10*3/uL (ref 0.0–0.5)
Eosinophils Relative: 2 %
HCT: 37.5 % (ref 36.0–46.0)
Hemoglobin: 12.1 g/dL (ref 12.0–15.0)
Immature Granulocytes: 0 %
Lymphocytes Relative: 37 %
Lymphs Abs: 2.1 10*3/uL (ref 0.7–4.0)
MCH: 28.3 pg (ref 26.0–34.0)
MCHC: 32.3 g/dL (ref 30.0–36.0)
MCV: 87.6 fL (ref 80.0–100.0)
Monocytes Absolute: 0.3 10*3/uL (ref 0.1–1.0)
Monocytes Relative: 6 %
Neutro Abs: 3.1 10*3/uL (ref 1.7–7.7)
Neutrophils Relative %: 54 %
Platelets: 345 10*3/uL (ref 150–400)
RBC: 4.28 MIL/uL (ref 3.87–5.11)
RDW: 13.6 % (ref 11.5–15.5)
WBC: 5.7 10*3/uL (ref 4.0–10.5)
nRBC: 0 % (ref 0.0–0.2)

## 2022-04-12 LAB — COMPREHENSIVE METABOLIC PANEL
ALT: 8 U/L (ref 0–44)
AST: 12 U/L — ABNORMAL LOW (ref 15–41)
Albumin: 4.6 g/dL (ref 3.5–5.0)
Alkaline Phosphatase: 46 U/L (ref 38–126)
Anion gap: 7 (ref 5–15)
BUN: 13 mg/dL (ref 6–20)
CO2: 28 mmol/L (ref 22–32)
Calcium: 10 mg/dL (ref 8.9–10.3)
Chloride: 102 mmol/L (ref 98–111)
Creatinine, Ser: 0.84 mg/dL (ref 0.44–1.00)
GFR, Estimated: >60 mL/min (ref >60)
Glucose, Bld: 93 mg/dL (ref 70–99)
Potassium: 3.7 mmol/L (ref 3.5–5.1)
Sodium: 137 mmol/L (ref 135–145)
Total Bilirubin: 0.3 mg/dL (ref 0.3–1.2)
Total Protein: 7.7 g/dL (ref 6.5–8.1)

## 2022-04-12 NOTE — ED Triage Notes (Signed)
Pt endorses SHOB and palpitations x 2 days  Worse today Slight CP Denies and N/V

## 2022-04-13 ENCOUNTER — Emergency Department (HOSPITAL_BASED_OUTPATIENT_CLINIC_OR_DEPARTMENT_OTHER)
Admission: EM | Admit: 2022-04-13 | Discharge: 2022-04-13 | Disposition: A | Discharge status: Home / Self Care | Payer: 59 | Attending: Emergency Medicine | Admitting: Emergency Medicine

## 2022-04-13 DIAGNOSIS — R0600 Dyspnea, unspecified: Secondary | ICD-10-CM

## 2022-04-13 NOTE — Discharge Instructions (Signed)
Drink plenty of fluids and get plenty of rest.  Follow-up with your primary doctor if your symptoms are not improving in the next few days, and return to the ER if symptoms significantly worsen or change.

## 2022-04-13 NOTE — ED Notes (Signed)
Pt verbalizes understanding of discharge instructions. Opportunity for questioning and answers were provided. Pt discharged from ED to home.   ? ?
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
## 2022-04-28 ENCOUNTER — Telehealth: Payer: 59 | Admitting: Family Medicine

## 2022-04-28 NOTE — Progress Notes (Signed)
Called pt at 4:15 pm for VV, no answer. Left VM that she can join the visit on her mychart or can call the office. Also sent link for VV.

## 2022-04-29 ENCOUNTER — Encounter: Payer: Self-pay | Admitting: Family Medicine

## 2022-04-30 ENCOUNTER — Telehealth (INDEPENDENT_AMBULATORY_CARE_PROVIDER_SITE_OTHER): Payer: 59 | Admitting: Family Medicine

## 2022-04-30 ENCOUNTER — Encounter: Payer: Self-pay | Admitting: Family Medicine

## 2022-04-30 DIAGNOSIS — Z566 Other physical and mental strain related to work: Secondary | ICD-10-CM

## 2022-04-30 DIAGNOSIS — F419 Anxiety disorder, unspecified: Secondary | ICD-10-CM | POA: Diagnosis not present

## 2022-04-30 NOTE — Progress Notes (Signed)
Virtual Visit via Video Note  I connected with Jessica Villarreal on 04/30/22 at  8:30 AM EDT by a video enabled telemedicine application 2/2 [id number] pandemic and verified that I am speaking with the correct person using two identifiers.  Location patient: home Location provider:work or home office Persons participating in the virtual visit: patient, provider  I discussed the limitations of evaluation and management by telemedicine and the availability of in person appointments. The patient expressed understanding and agreed to proceed.  Chief Complaint  Patient presents with   Stress    Stress at work since about April, highs and lows. Has been feeling anxious with and at work. Feels overwhelmed. Breathing exercises, trying to get back in the gym. Therapist advised to talk to PCP. GYN also advised to see PCP as BP is elevated 128/90.   Follow-up    Sensation in chest, comes and goes. Kinda like heart burn. Lab, ekg, ans scan was all normal    HPI: Pt endorses increased stress at work at [employer]. Pt states it is affecting her overall.   Patient in counseling. Some days feels productive, other days does not. Everyone at work is stressed due to increased workload. 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 Patient is in counseling. Setting boundaries doing deep breathing exercises.  Tries to go to the gym but often is unable to due to work.  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  This at times causes pt to have to cancel her own plans. May have a position change which would likely help reduce stress level. BP elevated at recent visit, 128/90, was told it was related to stress.  BP was not rechecked.   Of note patient with increased sodium intake is eating fast food and other foods that are convenient.  At times patient does not have time to eat or drink as she is so busy at work.  ROS: See  pertinent positives and negatives per HPI.  Past Medical History:  Diagnosis Date   Allergy    GERD (gastroesophageal reflux disease)    Hay fever    Ovarian cyst    Yeast infection    Social history: Patient is employed by [employer]. Past Surgical History:  Procedure Laterality Date   WISDOM TOOTH EXTRACTION      Family History  Problem Relation Age of Onset   Hypertension Mother    Healthy Father    Heart failure Maternal Grandmother    Diabetes Maternal Grandfather    Lymphoma Paternal Grandmother    Colon cancer Neg Hx    Stomach cancer Neg Hx    Rectal cancer Neg Hx    Esophageal cancer Neg Hx    Liver cancer Neg Hx     Current Outpatient Medications:    fluticasone (FLONASE) 50 MCG/ACT nasal spray, Place 1 spray into both nostrils daily., Disp: 16 g, Rfl: 3   Multiple Vitamin (MULTIVITAMIN) capsule, Take 1 capsule by mouth daily., Disp: , Rfl:    cyclobenzaprine (FLEXERIL) 5 MG tablet, Take 1 tablet (5 mg total) by mouth at bedtime. (Patient not taking: Reported on 03/10/2022), Disp: 30 tablet, Rfl: 0   naproxen (NAPROSYN) 500 MG tablet, Take 1 tablet (500 mg total) by mouth 2 (two) times daily. (Patient not taking: Reported on 03/10/2022), Disp: 30 tablet, Rfl: 0  EXAM:  VITALS per patient if applicable: RR  between 12 to 20 bpm  GENERAL: alert, oriented, appears well and in no acute distress  HEENT: atraumatic, conjunctiva clear, no obvious abnormalities on inspection of external nose and ears  NECK: normal movements of the head and neck  LUNGS: on inspection no signs of respiratory distress, breathing rate appears normal, no obvious gross SOB, gasping or wheezing  CV: no obvious cyanosis  MS: moves all visible extremities without noticeable abnormality  PSYCH/NEURO: pleasant and cooperative, no obvious depression or anxiety, speech and thought processing grossly intact  ASSESSMENT AND PLAN:  Discussed the following assessment and plan:  Stress at  work -Continue counseling -Continue deep breathing exercises and other self-care -Given note for work as may benefit from reduced workload in order to catch up on cases.  If more detailed letter needed patient to notify clinic.  Follow-up as needed for continued or worsening symptoms.   I discussed the assessment and treatment plan with the patient. The patient was provided an opportunity to ask questions and all were answered. The patient agreed with the plan and demonstrated an understanding of the instructions.   The patient was advised to call back or seek an in-person evaluation if the symptoms worsen or if the condition fails to improve as anticipated.  I provided 16 minutes of non-face-to-face time during this encounter.   Billie Ruddy, MD

## 2022-06-06 ENCOUNTER — Encounter: Payer: Self-pay | Admitting: Family Medicine

## 2022-06-06 DIAGNOSIS — E559 Vitamin D deficiency, unspecified: Secondary | ICD-10-CM

## 2022-06-18 NOTE — Telephone Encounter (Signed)
That is fine.  Patient can set up a lab appointment to have it checked.

## 2022-06-25 ENCOUNTER — Ambulatory Visit: Payer: 59 | Admitting: Family Medicine

## 2022-06-30 ENCOUNTER — Ambulatory Visit: Payer: 59 | Admitting: Family Medicine

## 2022-07-01 ENCOUNTER — Other Ambulatory Visit: Payer: Self-pay

## 2022-07-01 ENCOUNTER — Encounter: Payer: Self-pay | Admitting: Family Medicine

## 2022-07-01 ENCOUNTER — Ambulatory Visit: Payer: 59 | Admitting: Family Medicine

## 2022-07-01 VITALS — BP 122/86 | HR 97 | Temp 99.2°F | Wt 152.6 lb

## 2022-07-01 DIAGNOSIS — R202 Paresthesia of skin: Secondary | ICD-10-CM

## 2022-07-01 DIAGNOSIS — E559 Vitamin D deficiency, unspecified: Secondary | ICD-10-CM | POA: Diagnosis not present

## 2022-07-01 DIAGNOSIS — L0291 Cutaneous abscess, unspecified: Secondary | ICD-10-CM

## 2022-07-01 DIAGNOSIS — M62838 Other muscle spasm: Secondary | ICD-10-CM

## 2022-07-01 LAB — VITAMIN D 25 HYDROXY (VIT D DEFICIENCY, FRACTURES): VITD: 31.57 ng/mL (ref 30.00–100.00)

## 2022-07-01 NOTE — Progress Notes (Signed)
Subjective:    Patient ID: Jessica Villarreal, female    DOB: 10/09/1985, 36 y.o.   MRN: [id number]  Chief Complaint  Patient presents with   Cyst    Swollen node under right armpit. Noticed it yesterday. Small, moving, tender.     HPI Patient was seen today for acute concern.  Patient endorses painful bump under right armpit x3 days.  Patient was concerned area was a lymph node.  Tried vapor rub, apple cider vinegar and water, switching deodorants.  Patient states area was not sore but is now tender to touch as she has been manipulating it.  Patient endorses shaving armpits.  Had a similar lesion a few weeks ago that finally resolved on its own.  Patient denies fever, chills, nausea, vomiting.  Past Medical History:  Diagnosis Date   Allergy    GERD (gastroesophageal reflux disease)    Hay fever    Ovarian cyst    Yeast infection     Allergies  Allergen Reactions   Doxycycline Other (See Comments)    Stomach aches.  Stomach aches.     ROS General: Denies fever, chills, night sweats, changes in weight, changes in appetite HEENT: Denies headaches, ear pain, changes in vision, rhinorrhea, sore throat CV: Denies CP, palpitations, SOB, orthopnea Pulm: Denies SOB, cough, wheezing GI: Denies abdominal pain, nausea, vomiting, diarrhea, constipation GU: Denies dysuria, hematuria, frequency, vaginal discharge Msk: Denies muscle cramps, joint pains Neuro: Denies weakness, numbness, tingling  Skin: Denies rashes, bruising + bump in right axilla. Psych: Denies depression, anxiety, hallucinations      Objective:    Blood pressure 122/86, pulse 97, temperature 99.2 F (37.3 C), temperature source Oral, weight 152 lb 9.6 oz (69.2 kg), SpO2 99 %.  Gen. Pleasant, well-nourished, in no distress, normal affect   HEENT: [place]/AT, face symmetric, conjunctiva clear, no scleral icterus, PERRLA, EOMI, nares patent without drainage Lungs: no accessory muscle use Cardiovascular: RRR, no  peripheral edema Neuro:  A&Ox3, CN II-XII intact, normal gait Skin:  Warm, dry, intact.  Mildly fluctuant 1.2 cm x 0.5 cm abscess in right axilla, mild erythema, no drainage, or induration.   Wt Readings from Last 3 Encounters:  07/01/22 152 lb 9.6 oz (69.2 kg)  04/12/22 150 lb (68 kg)  03/10/22 154 lb 9.6 oz (70.1 kg)    Lab Results  Component Value Date   WBC 5.7 04/12/2022   HGB 12.1 04/12/2022   HCT 37.5 04/12/2022   PLT 345 04/12/2022   GLUCOSE 93 04/12/2022   CHOL 179 08/09/2019   TRIG 41.0 08/09/2019   HDL 55.20 08/09/2019   LDLCALC 116 (H) 08/09/2019   ALT 8 04/12/2022   AST 12 (L) 04/12/2022   NA 137 04/12/2022   K 3.7 04/12/2022   CL 102 04/12/2022   CREATININE 0.84 04/12/2022   BUN 13 04/12/2022   CO2 28 04/12/2022   TSH 1.47 08/13/2020   HGBA1C 5.3 08/09/2019    Assessment/Plan:  Abscess -Discussed supportive care as area is small and appears to be healing.  Patient advised to limit manipulation of skin is likely the cause increased symptoms.  Patient to apply warm compresses to area 4 times per day.  For continued or worsening symptoms I&D.  Also consider switching deodorants and using OTC Hibiclens cleanser.  F/u as needed  Grier Mitts, MD

## 2022-07-05 ENCOUNTER — Other Ambulatory Visit: Payer: 59

## 2022-08-18 ENCOUNTER — Ambulatory Visit: Payer: 59 | Admitting: Family Medicine

## 2022-08-20 ENCOUNTER — Ambulatory Visit: Payer: 59 | Admitting: Family Medicine

## 2022-08-20 VITALS — BP 120/78 | HR 105 | Temp 98.6°F | Wt 156.4 lb

## 2022-08-20 DIAGNOSIS — L0291 Cutaneous abscess, unspecified: Secondary | ICD-10-CM | POA: Diagnosis not present

## 2022-08-20 MED ORDER — AMOXICILLIN 500 MG PO TABS: 500.0000 mg | ORAL_TABLET | Freq: Two times a day (BID) | ORAL | 0 refills | Status: AC

## 2022-08-20 NOTE — Progress Notes (Signed)
   Acute Office Visit  Subjective:     Patient ID: Jessica Villarreal, female    DOB: [birth year], 37 y.o.   MRN: [id number]  No chief complaint on file.   HPI Patient is in today for acute concern.  Patient with recurring abscess in the right axilla.  Noticed 08/06/22.  Not draining.  Tender.  Denies fever, chills, nausea, vomiting.  Pt states OTC vit D made her feel jittery.  Review of Systems  Skin:        Bump in R axilla        Objective:    BP 120/78 (BP Location: Left Arm, Patient Position: Sitting, Cuff Size: Large)   Pulse (!) 105   Temp 98.6 F (37 C) (Oral)   Wt 156 lb 6.4 oz (70.9 kg)   LMP 07/26/2022 (Exact Date)   SpO2 98%   BMI 29.55 kg/m    Physical Exam Constitutional:      Appearance: Normal appearance.  HENT:     Head: Atraumatic.     Mouth/Throat:     Mouth: Mucous membranes are moist.  Cardiovascular:     Rate and Rhythm: Normal rate and regular rhythm.  Pulmonary:     Effort: Pulmonary effort is normal.  Skin:    General: Skin is warm and dry.     Findings: Abscess present.     Comments: Fluctuant, tender, raised area with mild erythema and induration in right axilla.  Neurological:     Mental Status: She is alert and oriented to person, place, and time. Mental status is at baseline.     No results found for any visits on 08/20/22.   Incision and Drainage Procedure Note  Pre-operative Diagnosis: abscess   Post-operative Diagnosis: same  Indications: pain  Anesthesia: 1% plain lidocaine  Procedure Details  The procedure, risks and complications have been discussed in detail (including, but not limited to airway compromise, infection, bleeding) with the patient, and the patient has signed consent to the procedure.  The skin was sterilely prepped and draped over the affected area in the usual fashion. After adequate local anesthesia, I&D with a #11 blade was performed on the right, axilla. Purulent drainage: present The  patient was observed until stable.  Findings: abscess  EBL: minimal cc's  Drains: none   Condition: Tolerated procedure well and Stable   Complications: none.       Assessment & Plan:   Problem List Items Addressed This Visit   None Visit Diagnoses     Abscess    -  Primary       Meds ordered this encounter  Medications   amoxicillin (AMOXIL) 500 MG tablet    Sig: Take 1 tablet (500 mg total) by mouth 2 (two) times daily for 7 days.    Dispense:  14 tablet    Refill:  0   Abscess in right axilla.  Consent obtained.  I&D performed.  Start ABX.  Given precautions.  Tylenol or NSAIDs as needed for discomfort.  Return if symptoms worsen or fail to improve.  Billie Ruddy, MD

## 2022-09-07 ENCOUNTER — Encounter: Payer: Self-pay | Admitting: Family Medicine

## 2022-09-17 ENCOUNTER — Ambulatory Visit (INDEPENDENT_AMBULATORY_CARE_PROVIDER_SITE_OTHER): Payer: 59

## 2022-09-17 ENCOUNTER — Encounter (HOSPITAL_COMMUNITY): Payer: Self-pay | Admitting: Emergency Medicine

## 2022-09-17 ENCOUNTER — Ambulatory Visit (HOSPITAL_COMMUNITY)
Admission: EM | Admit: 2022-09-17 | Discharge: 2022-09-17 | Disposition: A | Discharge status: Home / Self Care | Payer: 59 | Attending: Family Medicine | Admitting: Family Medicine

## 2022-09-17 DIAGNOSIS — R0600 Dyspnea, unspecified: Secondary | ICD-10-CM

## 2022-09-17 DIAGNOSIS — R002 Palpitations: Secondary | ICD-10-CM

## 2022-09-17 DIAGNOSIS — R0609 Other forms of dyspnea: Secondary | ICD-10-CM | POA: Diagnosis not present

## 2022-09-17 LAB — IRON AND TIBC
Iron: 54 ug/dL (ref 28–170)
Saturation Ratios: 10 % — ABNORMAL LOW (ref 10.4–31.8)
TIBC: 518 ug/dL — ABNORMAL HIGH (ref 250–450)
UIBC: 464 ug/dL

## 2022-09-17 LAB — BASIC METABOLIC PANEL
Anion gap: 8 (ref 5–15)
BUN: 12 mg/dL (ref 6–20)
CO2: 25 mmol/L (ref 22–32)
Calcium: 9.3 mg/dL (ref 8.9–10.3)
Chloride: 102 mmol/L (ref 98–111)
Creatinine, Ser: 0.88 mg/dL (ref 0.44–1.00)
GFR, Estimated: >60 mL/min (ref >60)
Glucose, Bld: 91 mg/dL (ref 70–99)
Potassium: 3.8 mmol/L (ref 3.5–5.1)
Sodium: 135 mmol/L (ref 135–145)

## 2022-09-17 LAB — CBC
HCT: 40.2 % (ref 36.0–46.0)
Hemoglobin: 13.1 g/dL (ref 12.0–15.0)
MCH: 27.9 pg (ref 26.0–34.0)
MCHC: 32.6 g/dL (ref 30.0–36.0)
MCV: 85.7 fL (ref 80.0–100.0)
Platelets: 354 10*3/uL (ref 150–400)
RBC: 4.69 MIL/uL (ref 3.87–5.11)
RDW: 14.3 % (ref 11.5–15.5)
WBC: 5 10*3/uL (ref 4.0–10.5)
nRBC: 0 % (ref 0.0–0.2)

## 2022-09-17 NOTE — Discharge Instructions (Signed)
Your EKG was normal  The chest x-ray was normal without fluid  Staff will notify you of any significant abnormalities on your blood work.  We drew blood to check your blood counts, iron, vitamin D, and electrolytes and sugar.  Please follow-up with your primary care about this issue

## 2022-09-17 NOTE — ED Triage Notes (Signed)
Pt reports noticed over past 2 days will have exertional SOB. States when walks up stairs or walking her dog. Denies cough or congestion.
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
## 2022-09-20 LAB — MISC LABCORP TEST (SEND OUT): Labcorp test code: 81950

## 2022-12-22 ENCOUNTER — Encounter: Payer: Self-pay | Admitting: Family Medicine

## 2022-12-22 ENCOUNTER — Telehealth: Payer: Self-pay | Admitting: Family Medicine

## 2022-12-22 ENCOUNTER — Ambulatory Visit: Payer: 59 | Admitting: Family Medicine

## 2022-12-22 VITALS — BP 120/86 | HR 95 | Temp 98.0°F | Wt 154.0 lb

## 2022-12-22 DIAGNOSIS — R1011 Right upper quadrant pain: Secondary | ICD-10-CM | POA: Diagnosis not present

## 2022-12-22 DIAGNOSIS — J301 Allergic rhinitis due to pollen: Secondary | ICD-10-CM | POA: Diagnosis not present

## 2022-12-22 DIAGNOSIS — H6991 Unspecified Eustachian tube disorder, right ear: Secondary | ICD-10-CM | POA: Diagnosis not present

## 2022-12-22 DIAGNOSIS — H9201 Otalgia, right ear: Secondary | ICD-10-CM | POA: Diagnosis not present

## 2022-12-22 NOTE — Patient Instructions (Signed)
You can try over-the-counter antihistamine such as Allegra, Claritin, Zyrtec, or Xyzal for your current symptoms.  You can also use a nasal spray such as saline nasal rinse, Flonase, or Nasacort for your symptoms.

## 2022-12-22 NOTE — Telephone Encounter (Signed)
Called pt and left vm to have pt call back to let us know what type of symptoms she is having
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
## 2022-12-27 ENCOUNTER — Encounter: Payer: 59 | Admitting: Family Medicine

## 2023-01-05 ENCOUNTER — Encounter: Payer: 59 | Admitting: Family Medicine

## 2023-01-13 ENCOUNTER — Telehealth: Payer: 59 | Admitting: Family Medicine

## 2023-01-13 DIAGNOSIS — R208 Other disturbances of skin sensation: Secondary | ICD-10-CM
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
## 2023-01-21 ENCOUNTER — Ambulatory Visit: Payer: 59 | Admitting: Family Medicine

## 2023-02-28 ENCOUNTER — Other Ambulatory Visit (HOSPITAL_BASED_OUTPATIENT_CLINIC_OR_DEPARTMENT_OTHER): Payer: Self-pay

## 2023-02-28 ENCOUNTER — Other Ambulatory Visit: Payer: Self-pay | Admitting: Family Medicine

## 2023-02-28 MED ORDER — DAILY-VITE MULTIVITAMIN PO TABS
1.0000 | ORAL_TABLET | Freq: Every day | ORAL | 3 refills | Status: AC
  Filled 2023-02-28: qty 100, 100d supply, fill #0

## 2023-03-01 ENCOUNTER — Other Ambulatory Visit (HOSPITAL_BASED_OUTPATIENT_CLINIC_OR_DEPARTMENT_OTHER): Payer: Self-pay

## 2023-03-02 ENCOUNTER — Other Ambulatory Visit (HOSPITAL_BASED_OUTPATIENT_CLINIC_OR_DEPARTMENT_OTHER): Payer: Self-pay

## 2023-03-11 ENCOUNTER — Other Ambulatory Visit (HOSPITAL_BASED_OUTPATIENT_CLINIC_OR_DEPARTMENT_OTHER): Payer: Self-pay

## 2023-03-14 ENCOUNTER — Ambulatory Visit (INDEPENDENT_AMBULATORY_CARE_PROVIDER_SITE_OTHER): Payer: 59 | Admitting: Family Medicine

## 2023-03-14 ENCOUNTER — Encounter: Payer: Self-pay | Admitting: Family Medicine

## 2023-03-14 VITALS — BP 110/88 | HR 100 | Temp 99.3°F | Ht 62.0 in | Wt 154.8 lb

## 2023-03-14 DIAGNOSIS — Z0000 Encounter for general adult medical examination without abnormal findings: Secondary | ICD-10-CM

## 2023-03-14 DIAGNOSIS — F419 Anxiety disorder, unspecified: Secondary | ICD-10-CM | POA: Diagnosis not present

## 2023-03-14 DIAGNOSIS — M25552 Pain in left hip: Secondary | ICD-10-CM

## 2023-03-14 DIAGNOSIS — Z8781 Personal history of (healed) traumatic fracture: Secondary | ICD-10-CM

## 2023-03-14 DIAGNOSIS — Z1159 Encounter for screening for other viral diseases: Secondary | ICD-10-CM

## 2023-03-14 LAB — COMPREHENSIVE METABOLIC PANEL
ALT: 11 U/L (ref 0–35)
AST: 16 U/L (ref 0–37)
Albumin: 4.6 g/dL (ref 3.5–5.2)
Alkaline Phosphatase: 60 U/L (ref 39–117)
BUN: 14 mg/dL (ref 6–23)
CO2: 24 mEq/L (ref 19–32)
Calcium: 10.1 mg/dL (ref 8.4–10.5)
Chloride: 101 mEq/L (ref 96–112)
Creatinine, Ser: 0.91 mg/dL (ref 0.40–1.20)
GFR: 79.9 mL/min (ref >60.00)
Glucose, Bld: 79 mg/dL (ref 70–99)
Potassium: 4.3 mEq/L (ref 3.5–5.1)
Sodium: 133 mEq/L — ABNORMAL LOW (ref 135–145)
Total Bilirubin: 0.5 mg/dL (ref 0.2–1.2)
Total Protein: 8.1 g/dL (ref 6.0–8.3)

## 2023-03-14 LAB — CBC WITH DIFFERENTIAL/PLATELET
Basophils Absolute: 0 10*3/uL (ref 0.0–0.1)
Basophils Relative: 0.6 % (ref 0.0–3.0)
Eosinophils Absolute: 0.1 10*3/uL (ref 0.0–0.7)
Eosinophils Relative: 1.7 % (ref 0.0–5.0)
HCT: 40.2 % (ref 36.0–46.0)
Hemoglobin: 12.7 g/dL (ref 12.0–15.0)
Lymphocytes Relative: 33.1 % (ref 12.0–46.0)
Lymphs Abs: 1.5 10*3/uL (ref 0.7–4.0)
MCHC: 31.6 g/dL (ref 30.0–36.0)
MCV: 84.9 fl (ref 78.0–100.0)
Monocytes Absolute: 0.3 10*3/uL (ref 0.1–1.0)
Monocytes Relative: 6.4 % (ref 3.0–12.0)
Neutro Abs: 2.7 10*3/uL (ref 1.4–7.7)
Neutrophils Relative %: 58.2 % (ref 43.0–77.0)
Platelets: 385 10*3/uL (ref 150.0–400.0)
RBC: 4.74 Mil/uL (ref 3.87–5.11)
RDW: 15.1 % (ref 11.5–15.5)
WBC: 4.7 10*3/uL (ref 4.0–10.5)

## 2023-03-14 LAB — T4, FREE: Free T4: 0.76 ng/dL (ref 0.60–1.60)

## 2023-03-14 LAB — LIPID PANEL
Cholesterol: 192 mg/dL (ref 0–200)
HDL: 53 mg/dL
LDL Cholesterol: 122 mg/dL — ABNORMAL HIGH (ref 0–99)
NonHDL: 138.75
Total CHOL/HDL Ratio: 4
Triglycerides: 82 mg/dL (ref 0.0–149.0)
VLDL: 16.4 mg/dL (ref 0.0–40.0)

## 2023-03-14 LAB — HEMOGLOBIN A1C: Hgb A1c MFr Bld: 5.7 % (ref 4.6–6.5)

## 2023-03-14 LAB — TSH: TSH: 1.75 u[IU]/mL (ref 0.35–5.50)

## 2023-03-14 LAB — VITAMIN D 25 HYDROXY (VIT D DEFICIENCY, FRACTURES): VITD: 28.52 ng/mL — ABNORMAL LOW (ref 30.00–100.00)
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
## 2023-03-18 ENCOUNTER — Other Ambulatory Visit: Payer: Self-pay | Admitting: Family Medicine

## 2023-03-18 DIAGNOSIS — E559 Vitamin D deficiency, unspecified: Secondary | ICD-10-CM

## 2023-03-18 MED ORDER — VITAMIN D (ERGOCALCIFEROL) 1.25 MG (50000 UNIT) PO CAPS: 50000.0000 [IU] | ORAL_CAPSULE | ORAL | 0 refills | Status: DC
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
## 2023-03-25 ENCOUNTER — Ambulatory Visit: Payer: 59

## 2023-03-30 ENCOUNTER — Ambulatory Visit (HOSPITAL_COMMUNITY)
Admission: EM | Admit: 2023-03-30 | Discharge: 2023-03-30 | Disposition: A | Discharge status: Home / Self Care | Payer: 59 | Attending: Family Medicine | Admitting: Family Medicine

## 2023-03-30 ENCOUNTER — Ambulatory Visit (INDEPENDENT_AMBULATORY_CARE_PROVIDER_SITE_OTHER): Payer: 59

## 2023-03-30 ENCOUNTER — Encounter (HOSPITAL_COMMUNITY): Payer: Self-pay

## 2023-03-30 DIAGNOSIS — M25572 Pain in left ankle and joints of left foot: Secondary | ICD-10-CM

## 2023-03-30 DIAGNOSIS — S93492A Sprain of other ligament of left ankle, initial encounter: Secondary | ICD-10-CM | POA: Diagnosis not present

## 2023-03-30 MED ORDER — IBUPROFEN 800 MG PO TABS
800.0000 mg | ORAL_TABLET | Freq: Three times a day (TID) | ORAL | 0 refills | Status: DC
Start: 2023-03-30 — End: 2024-05-18

## 2023-03-30 NOTE — ED Triage Notes (Signed)
Patient states she was walking and tripped, falling backwards at 0430 today and hurt her left ankle.  Patient denies taking any medication for her symptoms.

## 2023-03-31 NOTE — ED Provider Notes (Signed)
[facility]   [id number] 03/30/23 Arrival Time: 1726  ASSESSMENT & PLAN:  1. Acute left ankle pain   2. Sprain of anterior talofibular ligament of left ankle, initial encounter    I have personally viewed and independently interpreted the imaging studies ordered this visit. LEFT ankle: no fx/dislocations appreciated.   Discharge Medication List as of 03/30/2023  7:08 PM     START taking these medications   Details  ibuprofen (ADVIL) 800 MG tablet Take 1 tablet (800 mg total) by mouth 3 (three) times daily with meals., Starting Wed 03/30/2023, Normal        Orders Placed This Encounter  Procedures   DG Ankle Complete Left   Apply ASO lace-up ankle brace   See AVS for d/c information.  Recommend:  Follow-up Information     Schedule an appointment as soon as possible for a visit  with [facility].   Contact information: [address] [place] [address] [phone]                Reviewed expectations re: course of current medical issues. Questions answered. Outlined signs and symptoms indicating need for more acute intervention. Patient verbalized understanding. After Visit Summary given.  SUBJECTIVE: History from: patient. Jessica Villarreal is a 37 y.o. female who reports walking and tripping, falling backwards at 0430 today and hurt her left ankle. Lateral ankle pain. No extremity sensation changes or weakness. No tx PTA.   Past Surgical History:  Procedure Laterality Date   MYOMECTOMY  2022   WISDOM TOOTH EXTRACTION        OBJECTIVE:  Vitals:   03/30/23 1830  BP: 124/85  Pulse: 88  Resp: 16  Temp: 98.5 F (36.9 C)  TempSrc: Oral  SpO2: 99%    General appearance: alert; no distress HEENT: [place]; AT Neck: supple with FROM Resp: unlabored respirations Extremities: LLE: warm with well perfused appearance; fairly well localized moderate tenderness over left lateral ankle; without  gross deformities; swelling: minimal; bruising: none; ankle ROM: normal, with discomfort CV: brisk extremity capillary refill of LLE; 2+ DP pulse of LLE. Skin: warm and dry; no visible rashes Neurologic: normal sensation and strength of LLE Psychological: alert and cooperative; normal mood and affect  Imaging: DG Ankle Complete Left  Result Date: 03/30/2023 CLINICAL DATA:  Trauma to the left ankle. EXAM: LEFT ANKLE COMPLETE - 3+ VIEW COMPARISON:  None Available. FINDINGS: There is no evidence of fracture, dislocation, or joint effusion. There is no evidence of arthropathy or other focal bone abnormality. Soft tissues are unremarkable. IMPRESSION: Negative. Electronically Signed   By: Elgie Collard M.D.   On: 03/30/2023 18:54      Allergies  Allergen Reactions   Doxycycline Other (See Comments)    Stomach aches.  Stomach aches.     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  Tobacco Use   Smoking status: Never    Passive exposure: Never   Smokeless tobacco: Never  Vaping Use   Vaping  status: Never Used  Substance and Sexual Activity   Alcohol use: Yes    Alcohol/week: 1.0 standard drink of alcohol    Types: 1 Glasses of wine per week    Comment: wine socially   Drug use: No   Sexual activity: Yes    Birth control/protection: None  Other Topics Concern   Not on file  Social History Narrative   Not on file   Social Determinants of Health   Financial Resource Strain: Low Risk  (02/15/2022)   Received from [facility]   Overall Financial Resource Strain (CARDIA)    Difficulty of Paying Living Expenses: Not  hard at all  Food Insecurity: No Food Insecurity (02/15/2022)   Received from [facility]   Hunger Vital Sign    Worried About Running Out of Food in the Last Year: Never true    Ran Out of Food in the Last Year: Never true  Transportation Needs: Not on file  Physical Activity: Sufficiently Active (02/15/2022)   Received from [facility]   Exercise Vital Sign    Days of Exercise per Week: 3 days    Minutes of Exercise per Session: 50 min  Stress: No Stress Concern Present (02/15/2022)   Received from [facility], [facility] - Occupational Stress Questionnaire    Feeling of Stress : Only a little  Social Connections: Unknown (02/16/2023)   Received from [facility]   Social Network    Social Network: Not on file   Family History  Problem Relation Age of Onset   Hypertension Mother    Healthy Father    Heart failure Maternal Grandmother    Diabetes Maternal Grandfather    Lymphoma Paternal Grandmother    Colon cancer Neg Hx    Stomach cancer Neg Hx    Rectal cancer Neg Hx    Esophageal cancer Neg Hx    Liver cancer Neg Hx    Past Surgical History:  Procedure Laterality Date   MYOMECTOMY  2022   WISDOM TOOTH EXTRACTION         Mardella Layman, MD 03/31/23 1001

## 2023-04-07 ENCOUNTER — Other Ambulatory Visit: Payer: Self-pay

## 2023-04-07 ENCOUNTER — Ambulatory Visit: Payer: 59 | Attending: Family Medicine

## 2023-04-07 DIAGNOSIS — M25552 Pain in left hip: Secondary | ICD-10-CM | POA: Diagnosis present

## 2023-04-07 DIAGNOSIS — M6281 Muscle weakness (generalized): Secondary | ICD-10-CM | POA: Insufficient documentation

## 2023-04-07 DIAGNOSIS — Z8781 Personal history of (healed) traumatic fracture: Secondary | ICD-10-CM | POA: Diagnosis not present
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
## 2023-04-20 ENCOUNTER — Ambulatory Visit: Payer: 59

## 2023-04-27 ENCOUNTER — Ambulatory Visit: Payer: 59

## 2023-05-04 ENCOUNTER — Encounter: Payer: Self-pay | Admitting: Physical Therapy

## 2023-05-04 ENCOUNTER — Other Ambulatory Visit: Payer: Self-pay

## 2023-05-04 ENCOUNTER — Ambulatory Visit: Payer: 59 | Attending: Family Medicine | Admitting: Physical Therapy

## 2023-05-04 DIAGNOSIS — M62838 Other muscle spasm: Secondary | ICD-10-CM | POA: Diagnosis present

## 2023-05-04 DIAGNOSIS — M6281 Muscle weakness (generalized): Secondary | ICD-10-CM | POA: Diagnosis present

## 2023-05-04 DIAGNOSIS — M25552 Pain in left hip: Secondary | ICD-10-CM | POA: Diagnosis present
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
## 2023-05-11 ENCOUNTER — Ambulatory Visit: Payer: 59 | Admitting: Physical Therapy

## 2023-05-11 ENCOUNTER — Other Ambulatory Visit: Payer: Self-pay

## 2023-05-11 ENCOUNTER — Encounter: Payer: Self-pay | Admitting: Physical Therapy

## 2023-05-11 DIAGNOSIS — M25552 Pain in left hip: Secondary | ICD-10-CM | POA: Diagnosis not present

## 2023-05-11 DIAGNOSIS — M6281 Muscle weakness (generalized): Secondary | ICD-10-CM

## 2023-05-11 DIAGNOSIS — M62838 Other muscle spasm: Secondary | ICD-10-CM

## 2023-05-11 NOTE — Patient Instructions (Signed)
Access Code: [email or web address] URL: [email or web address] Date: 05/11/2023 Prepared by: Rosana Hoes  Exercises - Supine Figure 4 Piriformis Stretch  - 2 x daily - 7 x weekly - 2 reps - 30 sec hold - Seated Figure 4 Piriformis Stretch  - 2 x daily - 7 x weekly - 2 reps - 30 sec hold - Single Leg Bridge  - 1 x daily - 3 sets - 10 reps - Side Plank with Clam and Resistance  - 1 x daily - 3 sets - 10 reps
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
## 2023-05-18 ENCOUNTER — Ambulatory Visit: Payer: 59

## 2023-05-29 ENCOUNTER — Other Ambulatory Visit: Payer: Self-pay | Admitting: Family Medicine

## 2023-05-29 DIAGNOSIS — E559 Vitamin D deficiency, unspecified: Secondary | ICD-10-CM

## 2023-06-02 ENCOUNTER — Ambulatory Visit: Payer: 59

## 2023-09-24 ENCOUNTER — Emergency Department (HOSPITAL_BASED_OUTPATIENT_CLINIC_OR_DEPARTMENT_OTHER)
Admission: EM | Admit: 2023-09-24 | Discharge: 2023-09-24 | Disposition: A | Discharge status: Home / Self Care | Payer: 59 | Attending: Emergency Medicine | Admitting: Emergency Medicine

## 2023-09-24 ENCOUNTER — Other Ambulatory Visit: Payer: Self-pay

## 2023-09-24 DIAGNOSIS — Z20822 Contact with and (suspected) exposure to covid-19: Secondary | ICD-10-CM | POA: Insufficient documentation

## 2023-09-24 DIAGNOSIS — J101 Influenza due to other identified influenza virus with other respiratory manifestations: Secondary | ICD-10-CM | POA: Diagnosis not present

## 2023-09-24 DIAGNOSIS — R059 Cough, unspecified: Secondary | ICD-10-CM | POA: Diagnosis present

## 2023-09-24 LAB — RESP PANEL BY RT-PCR (RSV, FLU A&B, COVID)  RVPGX2
Influenza A by PCR: POSITIVE — AB
Influenza B by PCR: NEGATIVE
Resp Syncytial Virus by PCR: NEGATIVE
SARS Coronavirus 2 by RT PCR: NEGATIVE

## 2023-09-24 MED ORDER — GUAIFENESIN-DM 100-10 MG/5ML PO SYRP
5.0000 mL | ORAL_SOLUTION | Freq: Once | ORAL | Status: AC
  Administered 2023-09-24: 5 mL via ORAL
  Filled 2023-09-24: qty 5

## 2023-09-24 MED ORDER — KETOROLAC TROMETHAMINE 15 MG/ML IJ SOLN
15.0000 mg | Freq: Once | INTRAMUSCULAR | Status: AC
  Administered 2023-09-24: 15 mg via INTRAVENOUS
  Filled 2023-09-24: qty 1

## 2023-09-24 MED ORDER — ACETAMINOPHEN 325 MG PO TABS
650.0000 mg | ORAL_TABLET | Freq: Once | ORAL | Status: AC | PRN
  Administered 2023-09-24: 650 mg via ORAL
  Filled 2023-09-24: qty 2

## 2023-09-24 MED ORDER — OSELTAMIVIR PHOSPHATE 75 MG PO CAPS: 75.0000 mg | ORAL_CAPSULE | Freq: Two times a day (BID) | ORAL | 0 refills | Status: DC

## 2023-09-24 MED ORDER — SODIUM CHLORIDE 0.9 % IV BOLUS
1000.0000 mL | Freq: Once | INTRAVENOUS | Status: AC
  Administered 2023-09-24: 1000 mL via INTRAVENOUS

## 2023-09-24 NOTE — Discharge Instructions (Addendum)
 You have been seen today for your complaint of flulike symptoms. Your lab work was positive for influenza A. Your discharge medications include Alternate tylenol  and ibuprofen  for pain and fevers. You may alternate these every 4 hours. You may take up to 800 mg of ibuprofen  at a time and up to 1000 mg of tylenol . Begin using Claritin and Flonase  for congestion and runny nose. Use Delsym , Robitussin DM or Mucinex  DM for cough. Home care instructions are as follows:  Continue eating.  Drink plenty of water. Follow up with: Your primary care provider in 1 week Please seek immediate medical care if you develop any of the following symptoms: You become short of breath or have trouble breathing. Your skin or nails turn blue. You have very bad pain or stiffness in your neck. You get a sudden headache or pain in your face or ear. You vomit each time you eat or drink. At this time there does not appear to be the presence of an emergent medical condition, however there is always the potential for conditions to change. Please read and follow the below instructions.  Do not take your medicine if  develop an itchy rash, swelling in your mouth or lips, or difficulty breathing; call 911 and seek immediate emergency medical attention if this occurs.  You may review your lab tests and imaging results in their entirety on your MyChart account.  Please discuss all results of fully with your primary care provider and other specialist at your follow-up visit.  Note: Portions of this text may have been transcribed using voice recognition software. Every effort was made to ensure accuracy; however, inadvertent computerized transcription errors may still be present.

## 2023-09-24 NOTE — ED Provider Notes (Signed)
 [place] EMERGENCY DEPARTMENT AT [facility] Provider Note   CSN: [id number] Arrival date & time: 09/24/23  1844     History  Chief Complaint  Patient presents with   Cough    Jessica Villarreal is a 38 y.o. female.  With a history of GERD presenting to the ED for evaluation of cough and bodyaches.  Symptoms began yesterday evening.  The day before symptom onset she was exposed to her neighbor who was positive for influenza A.  She did not receive her flu vaccine this year.  She reports feeling warm but denies any fevers.  No nausea or vomiting.  No chest pain or shortness of breath.  Cough is nonproductive.   Cough Associated symptoms: myalgias        Home Medications Prior to Admission medications   Medication Sig Start Date End Date Taking? Authorizing Provider  oseltamivir  (TAMIFLU ) 75 MG capsule Take 1 capsule (75 mg total) by mouth every 12 (twelve) hours. 09/24/23  Yes Drexler Maland, Marsa HERO, PA-C  ibuprofen  (ADVIL ) 800 MG tablet Take 1 tablet (800 mg total) by mouth 3 (three) times daily with meals. 03/30/23   Rolinda Rogue, MD  Multiple Vitamin (DAILY-VITE MULTIVITAMIN) TABS Take 1 tablet by mouth daily. 02/28/23   Mercer Clotilda SAUNDERS, MD  Vitamin D , Ergocalciferol , (DRISDOL ) 1.25 MG (50000 UNIT) CAPS capsule Take 1 capsule (50,000 Units total) by mouth every 7 (seven) days. 03/18/23   Mercer Clotilda SAUNDERS, MD  azelastine (ASTELIN) 0.1 % nasal spray azelastine 137 mcg (0.1 %) nasal spray aerosol  10/15/19  [provider]      Allergies    Doxycycline     Review of Systems   Review of Systems  Respiratory:  Positive for cough.   Musculoskeletal:  Positive for myalgias.  All other systems reviewed and are negative.   Physical Exam Updated Vital Signs BP 121/71   Pulse (!) 106   Temp 99.7 F (37.6 C) (Oral)   Resp 18   Ht 5' 1 (1.549 m)   Wt 68 kg   LMP 09/18/2023 (Exact Date)   SpO2 98%   BMI 28.34 kg/m  Physical Exam Vitals and nursing note  reviewed.  Constitutional:      General: She is not in acute distress.    Appearance: Normal appearance. She is normal weight. She is not ill-appearing.  HENT:     Head: Normocephalic and atraumatic.  Cardiovascular:     Rate and Rhythm: Regular rhythm. Tachycardia present.  Pulmonary:     Effort: Pulmonary effort is normal. No respiratory distress.     Breath sounds: No wheezing, rhonchi or rales.  Abdominal:     General: Abdomen is flat.  Musculoskeletal:        General: Normal range of motion.     Cervical back: Neck supple.  Skin:    General: Skin is warm and dry.  Neurological:     Mental Status: She is alert and oriented to person, place, and time.  Psychiatric:        Mood and Affect: Mood normal.        Behavior: Behavior normal.     ED Results / Procedures / Treatments   Labs (all labs ordered are listed, but only abnormal results are displayed) Labs Reviewed  RESP PANEL BY RT-PCR (RSV, FLU A&B, COVID)  [id number] - Abnormal; Notable for the following components:      Result Value   Influenza A by PCR POSITIVE (*)    All  other components within normal limits    EKG None  Radiology No results found.  Procedures Procedures    Medications Ordered in ED Medications  acetaminophen  (TYLENOL ) tablet 650 mg (650 mg Oral Given 09/24/23 1924)  sodium chloride  0.9 % bolus 1,000 mL (0 mLs Intravenous Stopped 09/24/23 2203)  ketorolac  (TORADOL ) 15 MG/ML injection 15 mg (15 mg Intravenous Given 09/24/23 2102)  guaiFENesin -dextromethorphan  (ROBITUSSIN DM) 100-10 MG/5ML syrup 5 mL (5 mLs Oral Given 09/24/23 2117)    ED Course/ Medical Decision Making/ A&P                                 Medical Decision Making Risk OTC drugs. Prescription drug management.  This patient presents to the ED for concern of flulike symptoms, this involves an extensive number of treatment options, and is a complaint that carries with it a high risk of complications and morbidity.  The  differential diagnosis includes flu, COVID, RSV  My initial workup includes respiratory panel, symptom control  Additional history obtained from: Nursing notes from this visit.  I ordered, reviewed and interpreted labs which include: Respiratory panel.  Positive for influenza A.  Initially febrile and tachycardic in the setting of positive influenza A.  Otherwise hemodynamically stable.  38 year old female presenting to the ED for evaluation of flulike symptoms.  Flu likely the source of her symptoms.  Patient was given Tylenol , Toradol  and IV fluids with improvement in her vital signs.  She was educated on supportive care for her symptoms.  She was educated on typical timeline.  She was encouraged to follow-up with her primary care provider in 1 week for reevaluation.  She was given return precautions.  Stable at discharge.  At this time there does not appear to be any evidence of an acute emergency medical condition and the patient appears stable for discharge with appropriate outpatient follow up. Diagnosis was discussed with patient who verbalizes understanding of care plan and is agreeable to discharge. I have discussed return precautions with patient who verbalizes understanding. Patient encouraged to follow-up with their PCP within 1 week. All questions answered.  Note: Portions of this report may have been transcribed using voice recognition software. Every effort was made to ensure accuracy; however, inadvertent computerized transcription errors may still be present.        Final Clinical Impression(s) / ED Diagnoses Final diagnoses:  Influenza A    Rx / DC Orders ED Discharge Orders          Ordered    oseltamivir  (TAMIFLU ) 75 MG capsule  Every 12 hours        09/24/23 2220              Edwardo Marsa HERO, DEVONNA 09/24/23 2220    Dreama Longs, MD 09/27/23 1041

## 2023-09-24 NOTE — ED Triage Notes (Signed)
 Pt POV reporting dry cough and body aches that began last night. Denies SOB or chest pain.

## 2023-10-17 ENCOUNTER — Ambulatory Visit: Payer: 59 | Admitting: Family Medicine

## 2023-10-19 ENCOUNTER — Ambulatory Visit: Admitting: Family Medicine

## 2023-10-24 ENCOUNTER — Encounter: Payer: Self-pay | Admitting: Family Medicine

## 2023-10-24 ENCOUNTER — Ambulatory Visit: Admitting: Family Medicine

## 2023-10-24 VITALS — BP 120/80 | HR 54 | Temp 98.6°F | Ht 61.0 in | Wt 152.0 lb

## 2023-10-24 DIAGNOSIS — R42 Dizziness and giddiness: Secondary | ICD-10-CM | POA: Diagnosis not present

## 2023-10-24 DIAGNOSIS — J342 Deviated nasal septum: Secondary | ICD-10-CM | POA: Diagnosis not present

## 2023-10-24 DIAGNOSIS — J302 Other seasonal allergic rhinitis: Secondary | ICD-10-CM

## 2023-10-24 DIAGNOSIS — E860 Dehydration: Secondary | ICD-10-CM | POA: Diagnosis not present
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
## 2023-11-21 ENCOUNTER — Telehealth

## 2023-11-21 DIAGNOSIS — J069 Acute upper respiratory infection, unspecified: Secondary | ICD-10-CM

## 2023-11-22 MED ORDER — BENZONATATE 100 MG PO CAPS
100.0000 mg | ORAL_CAPSULE | Freq: Three times a day (TID) | ORAL | 0 refills | Status: AC | PRN
Start: 2023-11-22 — End: ?

## 2023-11-22 NOTE — Progress Notes (Signed)
 I have spent 5 minutes in review of e-visit questionnaire, review and updating patient chart, medical decision making and response to patient.   Piedad Climes, PA-C

## 2023-11-22 NOTE — Progress Notes (Signed)

## 2023-12-21 ENCOUNTER — Ambulatory Visit: Admitting: Family Medicine

## 2023-12-21 ENCOUNTER — Encounter: Payer: Self-pay | Admitting: Family Medicine

## 2023-12-21 VITALS — BP 122/74 | HR 87 | Temp 98.7°F | Ht 61.0 in | Wt 159.8 lb

## 2023-12-21 DIAGNOSIS — R5383 Other fatigue: Secondary | ICD-10-CM

## 2023-12-21 DIAGNOSIS — F439 Reaction to severe stress, unspecified: Secondary | ICD-10-CM | POA: Diagnosis not present

## 2023-12-21 DIAGNOSIS — M791 Myalgia, unspecified site: Secondary | ICD-10-CM

## 2023-12-21 DIAGNOSIS — S46812A Strain of other muscles, fascia and tendons at shoulder and upper arm level, left arm, initial encounter: Secondary | ICD-10-CM | POA: Diagnosis not present

## 2023-12-21 LAB — VITAMIN D 25 HYDROXY (VIT D DEFICIENCY, FRACTURES): VITD: 24.13 ng/mL — ABNORMAL LOW (ref 30.00–100.00)

## 2023-12-21 LAB — TSH: TSH: 1.62 u[IU]/mL (ref 0.35–5.50)

## 2023-12-21 LAB — T4, FREE: Free T4: 0.57 ng/dL — ABNORMAL LOW (ref 0.60–1.60)

## 2023-12-21 LAB — CBC WITH DIFFERENTIAL/PLATELET
Basophils Absolute: 0 10*3/uL (ref 0.0–0.1)
Basophils Relative: 0.4 % (ref 0.0–3.0)
Eosinophils Absolute: 0.1 10*3/uL (ref 0.0–0.7)
Eosinophils Relative: 1.5 % (ref 0.0–5.0)
HCT: 35.5 % — ABNORMAL LOW (ref 36.0–46.0)
Hemoglobin: 11.3 g/dL — ABNORMAL LOW (ref 12.0–15.0)
Lymphocytes Relative: 29.9 % (ref 12.0–46.0)
Lymphs Abs: 1.7 10*3/uL (ref 0.7–4.0)
MCHC: 31.8 g/dL (ref 30.0–36.0)
MCV: 81 fl (ref 78.0–100.0)
Monocytes Absolute: 0.4 10*3/uL (ref 0.1–1.0)
Monocytes Relative: 6.7 % (ref 3.0–12.0)
Neutro Abs: 3.5 10*3/uL (ref 1.4–7.7)
Neutrophils Relative %: 61.5 % (ref 43.0–77.0)
Platelets: 348 10*3/uL (ref 150.0–400.0)
RBC: 4.38 Mil/uL (ref 3.87–5.11)
RDW: 16.3 % — ABNORMAL HIGH (ref 11.5–15.5)
WBC: 5.7 10*3/uL (ref 4.0–10.5)

## 2023-12-21 LAB — VITAMIN B12: Vitamin B-12: 547 pg/mL (ref 211–911)
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
## 2023-12-22 LAB — IRON,TIBC AND FERRITIN PANEL
%SAT: 9 % — ABNORMAL LOW (ref 16–45)
Ferritin: 6 ng/mL — ABNORMAL LOW (ref 16–154)
Iron: 42 ug/dL (ref 40–190)
TIBC: 474 ug/dL — ABNORMAL HIGH (ref 250–450)

## 2023-12-23 ENCOUNTER — Encounter: Payer: Self-pay | Admitting: Family Medicine

## 2023-12-23 ENCOUNTER — Other Ambulatory Visit: Payer: Self-pay | Admitting: Family Medicine

## 2023-12-23 DIAGNOSIS — D509 Iron deficiency anemia, unspecified: Secondary | ICD-10-CM

## 2023-12-23 DIAGNOSIS — E559 Vitamin D deficiency, unspecified: Secondary | ICD-10-CM

## 2023-12-23 MED ORDER — FERROUS GLUCONATE 324 (38 FE) MG PO TABS: 324.0000 mg | ORAL_TABLET | Freq: Every day | ORAL | 3 refills | Status: AC

## 2023-12-23 MED ORDER — VITAMIN D (ERGOCALCIFEROL) 1.25 MG (50000 UNIT) PO CAPS: 50000.0000 [IU] | ORAL_CAPSULE | ORAL | 0 refills | Status: AC

## 2024-01-19 ENCOUNTER — Other Ambulatory Visit: Payer: Self-pay

## 2024-01-19 ENCOUNTER — Emergency Department (HOSPITAL_BASED_OUTPATIENT_CLINIC_OR_DEPARTMENT_OTHER)
Admission: EM | Admit: 2024-01-19 | Discharge: 2024-01-20 | Disposition: A | Discharge status: Home / Self Care | Attending: Emergency Medicine | Admitting: Emergency Medicine

## 2024-01-19 ENCOUNTER — Encounter (HOSPITAL_BASED_OUTPATIENT_CLINIC_OR_DEPARTMENT_OTHER): Payer: Self-pay

## 2024-01-19 DIAGNOSIS — M79662 Pain in left lower leg: Secondary | ICD-10-CM | POA: Insufficient documentation

## 2024-01-19 NOTE — ED Triage Notes (Signed)
 Pt c/o discomfort in L leg, pain "all over" onset last night. States pain was "throbbing- enough to notice it," better w compression socks. Advises that she feels like she's pulled a muscle. Denies swelling, states she "might have seen some redness"

## 2024-01-20 ENCOUNTER — Ambulatory Visit (HOSPITAL_BASED_OUTPATIENT_CLINIC_OR_DEPARTMENT_OTHER): Admission: RE | Admit: 2024-01-20 | Source: Ambulatory Visit

## 2024-01-20 ENCOUNTER — Ambulatory Visit (HOSPITAL_BASED_OUTPATIENT_CLINIC_OR_DEPARTMENT_OTHER)

## 2024-01-20 ENCOUNTER — Encounter (HOSPITAL_BASED_OUTPATIENT_CLINIC_OR_DEPARTMENT_OTHER): Payer: Self-pay

## 2024-01-20 ENCOUNTER — Ambulatory Visit (HOSPITAL_BASED_OUTPATIENT_CLINIC_OR_DEPARTMENT_OTHER)
Admission: RE | Admit: 2024-01-20 | Discharge: 2024-01-20 | Disposition: A | Discharge status: Home / Self Care | Source: Ambulatory Visit | Attending: Emergency Medicine | Admitting: Emergency Medicine

## 2024-01-20 ENCOUNTER — Other Ambulatory Visit (HOSPITAL_BASED_OUTPATIENT_CLINIC_OR_DEPARTMENT_OTHER): Payer: Self-pay | Admitting: Emergency Medicine

## 2024-01-20 DIAGNOSIS — M79605 Pain in left leg: Secondary | ICD-10-CM

## 2024-01-20 NOTE — ED Provider Notes (Signed)
 [place] EMERGENCY DEPARTMENT AT [facility] Provider Note   CSN: [id number] Arrival date & time: 01/19/24  2033     History  No chief complaint on file.   Judye Kanya Potteiger is a 38 y.o. female.  Patient is a 38 year old female presenting with pain in her left leg.  She describes a 2-day history of discomfort to the lateral aspect of her left lower leg.  She describes it as a burning pain that is constant.  She has put on a compression hose with some relief.  She denies any injury or trauma.  She denies any weakness or numbness.  She is concerned about possibility of blood clot.       Home Medications Prior to Admission medications   Medication Sig Start Date End Date Taking? Authorizing Provider  benzonatate  (TESSALON ) 100 MG capsule Take 1 capsule (100 mg total) by mouth 3 (three) times daily as needed for cough. 11/22/23   Farris Hong, PA-C  ferrous gluconate  (FERGON) 324 MG tablet Take 1 tablet (324 mg total) by mouth daily. 12/23/23   Viola Greulich, MD  ibuprofen  (ADVIL ) 800 MG tablet Take 1 tablet (800 mg total) by mouth 3 (three) times daily with meals. 03/30/23   Afton Albright, MD  Multiple Vitamin (DAILY-VITE MULTIVITAMIN) TABS Take 1 tablet by mouth daily. 02/28/23   Viola Greulich, MD  Vitamin D , Ergocalciferol , (DRISDOL ) 1.25 MG (50000 UNIT) CAPS capsule Take 1 capsule (50,000 Units total) by mouth every 7 (seven) days. 12/23/23   Viola Greulich, MD  azelastine (ASTELIN) 0.1 % nasal spray azelastine 137 mcg (0.1 %) nasal spray aerosol  10/15/19  [provider]      Allergies    Doxycycline     Review of Systems   Review of Systems  All other systems reviewed and are negative.   Physical Exam Updated Vital Signs BP 120/87   Pulse 87   Temp 98 F (36.7 C)   Resp 18   LMP  (LMP Unknown)   SpO2 100%  Physical Exam Vitals and nursing note reviewed.  Constitutional:      Appearance: Normal appearance.  Pulmonary:     Effort:  Pulmonary effort is normal.  Musculoskeletal:     Comments: The left lower extremity is grossly normal in appearance.  There is no calf tenderness or swelling.  No edema.  Celine Collard' sign is absent.  DP pulses are palpable.  Skin:    General: Skin is warm and dry.  Neurological:     Mental Status: She is alert and oriented to person, place, and time.     ED Results / Procedures / Treatments   Labs (all labs ordered are listed, but only abnormal results are displayed) Labs Reviewed - No data to display  EKG None  Radiology No results found.  Procedures Procedures    Medications Ordered in ED Medications - No data to display  ED Course/ Medical Decision Making/ A&P  Patient presenting with complaints of left leg pain as described in the HPI.  I suspect a musculoskeletal etiology, but patient concerned about a blood clot.  I will make arrangements for ultrasound and have him morning as they have gone home for the day.  To take ibuprofen  in the meantime and return as needed if symptoms worsen or change.  Final Clinical Impression(s) / ED Diagnoses Final diagnoses:  None    Rx / DC Orders ED Discharge Orders     None  Orvilla Blander, MD 01/20/24 [phone]

## 2024-01-20 NOTE — Discharge Instructions (Signed)
 Return tomorrow at the given time for an ultrasound of your leg to rule out a blood clot.    Take ibuprofen  600 mg every 6 hours as needed for pain.

## 2024-01-28 ENCOUNTER — Ambulatory Visit (HOSPITAL_BASED_OUTPATIENT_CLINIC_OR_DEPARTMENT_OTHER): Admission: RE | Admit: 2024-01-28 | Source: Ambulatory Visit

## 2024-01-28 ENCOUNTER — Encounter (HOSPITAL_BASED_OUTPATIENT_CLINIC_OR_DEPARTMENT_OTHER): Payer: Self-pay

## 2024-04-07 ENCOUNTER — Other Ambulatory Visit: Payer: Self-pay | Admitting: Family Medicine

## 2024-04-07 DIAGNOSIS — E559 Vitamin D deficiency, unspecified: Secondary | ICD-10-CM

## 2024-04-18 ENCOUNTER — Encounter: Payer: Self-pay | Admitting: Family Medicine

## 2024-04-18 ENCOUNTER — Ambulatory Visit (INDEPENDENT_AMBULATORY_CARE_PROVIDER_SITE_OTHER): Admitting: Family Medicine

## 2024-04-18 VITALS — BP 126/70 | HR 82 | Temp 98.6°F | Ht 61.0 in | Wt 157.4 lb

## 2024-04-18 DIAGNOSIS — D509 Iron deficiency anemia, unspecified: Secondary | ICD-10-CM

## 2024-04-18 DIAGNOSIS — Z0000 Encounter for general adult medical examination without abnormal findings: Secondary | ICD-10-CM | POA: Diagnosis not present

## 2024-04-18 DIAGNOSIS — F419 Anxiety disorder, unspecified: Secondary | ICD-10-CM

## 2024-04-18 DIAGNOSIS — D219 Benign neoplasm of connective and other soft tissue, unspecified: Secondary | ICD-10-CM | POA: Diagnosis not present

## 2024-04-18 DIAGNOSIS — E559 Vitamin D deficiency, unspecified: Secondary | ICD-10-CM

## 2024-04-18 DIAGNOSIS — R0982 Postnasal drip: Secondary | ICD-10-CM | POA: Diagnosis not present

## 2024-04-18 LAB — COMPREHENSIVE METABOLIC PANEL WITH GFR
ALT: 11 U/L (ref 0–35)
AST: 17 U/L (ref 0–37)
Albumin: 4.2 g/dL (ref 3.5–5.2)
Alkaline Phosphatase: 55 U/L (ref 39–117)
BUN: 14 mg/dL (ref 6–23)
CO2: 28 meq/L (ref 19–32)
Calcium: 9.5 mg/dL (ref 8.4–10.5)
Chloride: 102 meq/L (ref 96–112)
Creatinine, Ser: 0.89 mg/dL (ref 0.40–1.20)
GFR: 81.43 mL/min (ref >60.00)
Glucose, Bld: 77 mg/dL (ref 70–99)
Potassium: 4.8 meq/L (ref 3.5–5.1)
Sodium: 137 meq/L (ref 135–145)
Total Bilirubin: 0.4 mg/dL (ref 0.2–1.2)
Total Protein: 7.6 g/dL (ref 6.0–8.3)

## 2024-04-18 LAB — CBC WITH DIFFERENTIAL/PLATELET
Basophils Absolute: 0 K/uL (ref 0.0–0.1)
Basophils Relative: 0.9 % (ref 0.0–3.0)
Eosinophils Absolute: 0.1 K/uL (ref 0.0–0.7)
Eosinophils Relative: 2.9 % (ref 0.0–5.0)
HCT: 37.2 % (ref 36.0–46.0)
Hemoglobin: 12 g/dL (ref 12.0–15.0)
Lymphocytes Relative: 43 % (ref 12.0–46.0)
Lymphs Abs: 1.4 K/uL (ref 0.7–4.0)
MCHC: 32.2 g/dL (ref 30.0–36.0)
MCV: 84.5 fl (ref 78.0–100.0)
Monocytes Absolute: 0.2 K/uL (ref 0.1–1.0)
Monocytes Relative: 6.6 % (ref 3.0–12.0)
Neutro Abs: 1.5 K/uL (ref 1.4–7.7)
Neutrophils Relative %: 46.6 % (ref 43.0–77.0)
Platelets: 328 K/uL (ref 150.0–400.0)
RBC: 4.41 Mil/uL (ref 3.87–5.11)
RDW: 17.2 % — ABNORMAL HIGH (ref 11.5–15.5)
WBC: 3.3 K/uL — ABNORMAL LOW (ref 4.0–10.5)

## 2024-04-18 LAB — LIPID PANEL
Cholesterol: 182 mg/dL (ref 0–200)
HDL: 52.1 mg/dL (ref >39.00)
LDL Cholesterol: 113 mg/dL — ABNORMAL HIGH (ref 0–99)
NonHDL: 130.29
Total CHOL/HDL Ratio: 4
Triglycerides: 86 mg/dL (ref 0.0–149.0)
VLDL: 17.2 mg/dL (ref 0.0–40.0)

## 2024-04-18 LAB — HEMOGLOBIN A1C: Hgb A1c MFr Bld: 5.9 % (ref 4.6–6.5)

## 2024-04-18 LAB — T4, FREE: Free T4: 0.9 ng/dL (ref 0.60–1.60)

## 2024-04-18 LAB — TSH: TSH: 1.08 u[IU]/mL (ref 0.35–5.50)

## 2024-04-18 LAB — VITAMIN D 25 HYDROXY (VIT D DEFICIENCY, FRACTURES): VITD: 42.93 ng/mL (ref 30.00–100.00)

## 2024-04-18 LAB — VITAMIN B12: Vitamin B-12: 844 pg/mL (ref 211–911)
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
## 2024-04-19 LAB — IRON,TIBC AND FERRITIN PANEL
%SAT: 20 % (ref 16–45)
Ferritin: 8 ng/mL — ABNORMAL LOW (ref 16–154)
Iron: 84 ug/dL (ref 40–190)
TIBC: 413 ug/dL (ref 250–450)

## 2024-04-23 ENCOUNTER — Ambulatory Visit: Payer: Self-pay | Admitting: Family Medicine

## 2024-04-25 ENCOUNTER — Other Ambulatory Visit: Payer: Self-pay | Admitting: Obstetrics and Gynecology

## 2024-05-11 ENCOUNTER — Encounter (HOSPITAL_COMMUNITY): Payer: Self-pay | Admitting: Obstetrics and Gynecology

## 2024-05-11 ENCOUNTER — Encounter (HOSPITAL_COMMUNITY): Payer: Self-pay

## 2024-05-11 NOTE — Progress Notes (Signed)
 Spoke w/ via phone for pre-op interview--- [name] needs dos----   CBC and UPT per surgeon.      Lab results------ COVID test -----patient states asymptomatic no test needed Arrive at -------0700 NPO after MN NO Solid Food.   Pre-Surgery Ensure or G2:  Med rec completed Medications to take morning of surgery ----- NONE Diabetic medication -----  GLP1 agonist last dose: GLP1 instructions:  Patient instructed no nail polish to be worn day of surgery Patient instructed to bring photo id and insurance card day of surgery Patient aware to have Driver (ride ) / caregiver    for 24 hours after surgery - Sister Jessica Villarreal Patient Special Instructions ----- Pre-Op special Instructions -----  Patient verbalized understanding of instructions that were given at this phone interview. Patient denies chest pain, sob, fever, cough at the interview.

## 2024-05-18 ENCOUNTER — Ambulatory Visit (HOSPITAL_COMMUNITY): Admitting: Anesthesiology

## 2024-05-18 ENCOUNTER — Ambulatory Visit (HOSPITAL_COMMUNITY)
Admission: RE | Admit: 2024-05-18 | Discharge: 2024-05-18 | Disposition: A | Discharge status: Home / Self Care | Attending: Obstetrics and Gynecology | Admitting: Obstetrics and Gynecology

## 2024-05-18 ENCOUNTER — Encounter (HOSPITAL_COMMUNITY)
Admission: RE | Disposition: A | Discharge status: Home / Self Care | Payer: Self-pay | Source: Home / Self Care | Attending: Obstetrics and Gynecology

## 2024-05-18 ENCOUNTER — Encounter (HOSPITAL_COMMUNITY): Payer: Self-pay | Admitting: Obstetrics and Gynecology

## 2024-05-18 ENCOUNTER — Other Ambulatory Visit: Payer: Self-pay

## 2024-05-18 DIAGNOSIS — N840 Polyp of corpus uteri: Secondary | ICD-10-CM | POA: Diagnosis present

## 2024-05-18 DIAGNOSIS — N920 Excessive and frequent menstruation with regular cycle: Secondary | ICD-10-CM | POA: Insufficient documentation

## 2024-05-18 DIAGNOSIS — N841 Polyp of cervix uteri: Secondary | ICD-10-CM | POA: Insufficient documentation

## 2024-05-18 DIAGNOSIS — K219 Gastro-esophageal reflux disease without esophagitis: Secondary | ICD-10-CM | POA: Diagnosis not present

## 2024-05-18 HISTORY — PX: DILATATION & CURRETTAGE/HYSTEROSCOPY WITH RESECTOCOPE: SHX5572

## 2024-05-18 HISTORY — DX: Prediabetes: R73.03

## 2024-05-18 LAB — POCT PREGNANCY, URINE: Preg Test, Ur: NEGATIVE

## 2024-05-18 LAB — CBC
HCT: 38.4 % (ref 36.0–46.0)
Hemoglobin: 12.5 g/dL (ref 12.0–15.0)
MCH: 28.7 pg (ref 26.0–34.0)
MCHC: 32.6 g/dL (ref 30.0–36.0)
MCV: 88.1 fL (ref 80.0–100.0)
Platelets: 324 K/uL (ref 150–400)
RBC: 4.36 MIL/uL (ref 3.87–5.11)
RDW: 14.7 % (ref 11.5–15.5)
WBC: 6.2 K/uL (ref 4.0–10.5)
nRBC: 0 % (ref 0.0–0.2)

## 2024-05-18 SURGERY — DILATATION & CURETTAGE/HYSTEROSCOPY WITH RESECTOCOPE
Anesthesia: General | Site: Vagina

## 2024-05-18 MED ORDER — PROPOFOL 10 MG/ML IV BOLUS
INTRAVENOUS | Status: AC
  Filled 2024-05-18: qty 20

## 2024-05-18 MED ORDER — DEXMEDETOMIDINE HCL IN NACL 80 MCG/20ML IV SOLN
INTRAVENOUS | Status: DC | PRN
  Administered 2024-05-18 (×2): 8 ug via INTRAVENOUS
  Administered 2024-05-18: 4 ug via INTRAVENOUS

## 2024-05-18 MED ORDER — EPHEDRINE SULFATE-NACL 50-0.9 MG/10ML-% IV SOSY
PREFILLED_SYRINGE | INTRAVENOUS | Status: DC | PRN
  Administered 2024-05-18 (×2): 5 mg via INTRAVENOUS

## 2024-05-18 MED ORDER — SODIUM CHLORIDE 0.9 % IR SOLN
Status: DC | PRN
  Administered 2024-05-18 (×2): 3000 mL

## 2024-05-18 MED ORDER — DEXAMETHASONE SODIUM PHOSPHATE 10 MG/ML IJ SOLN
INTRAMUSCULAR | Status: AC
  Filled 2024-05-18: qty 1

## 2024-05-18 MED ORDER — LIDOCAINE 2% (20 MG/ML) 5 ML SYRINGE
INTRAMUSCULAR | Status: AC
  Filled 2024-05-18: qty 5

## 2024-05-18 MED ORDER — OXYCODONE HCL 5 MG/5ML PO SOLN: 5.0000 mg | Freq: Once | ORAL | Status: AC | PRN

## 2024-05-18 MED ORDER — GLYCOPYRROLATE PF 0.2 MG/ML IJ SOSY
PREFILLED_SYRINGE | INTRAMUSCULAR | Status: DC | PRN
  Administered 2024-05-18: .2 mg via INTRAVENOUS

## 2024-05-18 MED ORDER — AMISULPRIDE (ANTIEMETIC) 5 MG/2ML IV SOLN: 10.0000 mg | Freq: Once | INTRAVENOUS | Status: DC | PRN

## 2024-05-18 MED ORDER — POVIDONE-IODINE 10 % EX SWAB: 2.0000 | Freq: Once | CUTANEOUS | Status: DC

## 2024-05-18 MED ORDER — MIDAZOLAM HCL 2 MG/2ML IJ SOLN
INTRAMUSCULAR | Status: AC
  Filled 2024-05-18: qty 2

## 2024-05-18 MED ORDER — SODIUM CHLORIDE 0.9 % IV SOLN: 12.5000 mg | INTRAVENOUS | Status: DC | PRN

## 2024-05-18 MED ORDER — OXYCODONE HCL 5 MG PO TABS
ORAL_TABLET | ORAL | Status: AC
  Filled 2024-05-18: qty 1

## 2024-05-18 MED ORDER — FENTANYL CITRATE (PF) 250 MCG/5ML IJ SOLN
INTRAMUSCULAR | Status: AC
  Filled 2024-05-18: qty 5

## 2024-05-18 MED ORDER — PROPOFOL 10 MG/ML IV BOLUS
INTRAVENOUS | Status: DC | PRN
  Administered 2024-05-18: 200 mg via INTRAVENOUS

## 2024-05-18 MED ORDER — MIDAZOLAM HCL 2 MG/2ML IJ SOLN
INTRAMUSCULAR | Status: DC | PRN
  Administered 2024-05-18: 2 mg via INTRAVENOUS

## 2024-05-18 MED ORDER — HYDROMORPHONE HCL 1 MG/ML IJ SOLN
0.2500 mg | INTRAMUSCULAR | Status: DC | PRN
  Administered 2024-05-18: 0.25 mg via INTRAVENOUS
  Administered 2024-05-18: 0.5 mg via INTRAVENOUS
  Administered 2024-05-18: 0.25 mg via INTRAVENOUS

## 2024-05-18 MED ORDER — ONDANSETRON HCL 4 MG/2ML IJ SOLN
INTRAMUSCULAR | Status: DC | PRN
  Administered 2024-05-18: 4 mg via INTRAVENOUS

## 2024-05-18 MED ORDER — MEPERIDINE HCL 25 MG/ML IJ SOLN: 6.2500 mg | INTRAMUSCULAR | Status: DC | PRN

## 2024-05-18 MED ORDER — ORAL CARE MOUTH RINSE: 15.0000 mL | Freq: Once | OROMUCOSAL | Status: AC

## 2024-05-18 MED ORDER — FENTANYL CITRATE (PF) 250 MCG/5ML IJ SOLN
INTRAMUSCULAR | Status: DC | PRN
  Administered 2024-05-18 (×2): 50 ug via INTRAVENOUS

## 2024-05-18 MED ORDER — HYDROMORPHONE HCL 1 MG/ML IJ SOLN
INTRAMUSCULAR | Status: AC
  Filled 2024-05-18: qty 1

## 2024-05-18 MED ORDER — CHLORHEXIDINE GLUCONATE 0.12 % MT SOLN
OROMUCOSAL | Status: AC
  Filled 2024-05-18: qty 15

## 2024-05-18 MED ORDER — LACTATED RINGERS IV SOLN
INTRAVENOUS | Status: DC
Start: 2024-05-18 — End: 2024-05-18

## 2024-05-18 MED ORDER — KETOROLAC TROMETHAMINE 30 MG/ML IJ SOLN
INTRAMUSCULAR | Status: DC | PRN
  Administered 2024-05-18: 30 mg via INTRAVENOUS

## 2024-05-18 MED ORDER — LIDOCAINE 2% (20 MG/ML) 5 ML SYRINGE
INTRAMUSCULAR | Status: DC | PRN
Start: 2024-05-18 — End: 2024-05-18
  Administered 2024-05-18: 60 mg via INTRAVENOUS

## 2024-05-18 MED ORDER — CHLORHEXIDINE GLUCONATE 0.12 % MT SOLN
15.0000 mL | Freq: Once | OROMUCOSAL | Status: AC
  Administered 2024-05-18: 15 mL via OROMUCOSAL

## 2024-05-18 MED ORDER — KETOROLAC TROMETHAMINE 30 MG/ML IJ SOLN
INTRAMUSCULAR | Status: AC
  Filled 2024-05-18: qty 1

## 2024-05-18 MED ORDER — OXYCODONE HCL 5 MG PO TABS
5.0000 mg | ORAL_TABLET | Freq: Once | ORAL | Status: AC | PRN
  Administered 2024-05-18: 5 mg via ORAL

## 2024-05-18 MED ORDER — ONDANSETRON HCL 4 MG/2ML IJ SOLN
INTRAMUSCULAR | Status: AC
  Filled 2024-05-18: qty 2

## 2024-05-18 MED ORDER — DEXAMETHASONE SODIUM PHOSPHATE 10 MG/ML IJ SOLN
INTRAMUSCULAR | Status: DC | PRN
  Administered 2024-05-18: 10 mg via INTRAVENOUS

## 2024-05-18 SURGICAL SUPPLY — 17 items
CATH ROBINSON RED A/P 16FR (CATHETERS) ×1 IMPLANT
DEVICE MYOSURE LITE (MISCELLANEOUS) IMPLANT
DEVICE MYOSURE REACH (MISCELLANEOUS) IMPLANT
DILATOR CANAL MILEX (MISCELLANEOUS) IMPLANT
GLOVE ECLIPSE 6.5 STRL STRAW (GLOVE) ×1 IMPLANT
GLOVE SURG UNDER POLY LF SZ7 (GLOVE) ×1 IMPLANT
GOWN STRL REUS W/ TWL LRG LVL3 (GOWN DISPOSABLE) ×1 IMPLANT
KIT PROCED FLUENT PRO FLT212S (KITS) IMPLANT
KIT PROCEDURE FLUENT (KITS) ×1 IMPLANT
KIT TURNOVER KIT B (KITS) ×1 IMPLANT
PACK VAGINAL MINOR WOMEN LF (CUSTOM PROCEDURE TRAY) ×1 IMPLANT
SEAL CERVICAL OMNI LOK (ABLATOR) IMPLANT
SEAL ROD LENS SCOPE MYOSURE (ABLATOR) ×1 IMPLANT
SOL .9 NS 3000ML IRR UROMATIC (IV SOLUTION) IMPLANT
SYSTEM TISS REMOVAL MYOSURE XL (MISCELLANEOUS) IMPLANT
TOWEL GREEN STERILE FF (TOWEL DISPOSABLE) ×1 IMPLANT
UNDERPAD 30X36 HEAVY ABSORB (UNDERPADS AND DIAPERS) ×1 IMPLANT

## 2024-05-18 NOTE — Anesthesia Preprocedure Evaluation (Addendum)
 Anesthesia Evaluation  Patient identified by MRN, date of birth, ID band Patient awake    Reviewed: Allergy & Precautions, H&P , NPO status , Patient's Chart, lab work & pertinent test results  Airway Mallampati: II  TM Distance: >3 FB Neck ROM: Full    Dental no notable dental hx.    Pulmonary neg pulmonary ROS   Pulmonary exam normal breath sounds clear to auscultation       Cardiovascular negative cardio ROS Normal cardiovascular exam Rhythm:Regular Rate:Normal     Neuro/Psych   Anxiety     negative neurological ROS  negative psych ROS   GI/Hepatic Neg liver ROS,GERD  ,,  Endo/Other  negative endocrine ROS    Renal/GU negative Renal ROS  negative genitourinary   Musculoskeletal negative musculoskeletal ROS (+)    Abdominal   Peds negative pediatric ROS (+)  Hematology negative hematology ROS (+)   Anesthesia Other Findings   Reproductive/Obstetrics negative OB ROS                              Anesthesia Physical Anesthesia Plan  ASA: 2  Anesthesia Plan: General   Post-op Pain Management:    Induction: Intravenous  PONV Risk Score and Plan: 3 and Ondansetron , Dexamethasone, Midazolam and Treatment may vary due to age or medical condition  Airway Management Planned: LMA  Additional Equipment:   Intra-op Plan:   Post-operative Plan: Extubation in OR  Informed Consent: I have reviewed the patients History and Physical, chart, labs and discussed the procedure including the risks, benefits and alternatives for the proposed anesthesia with the patient or authorized representative who has indicated his/her understanding and acceptance.     Dental advisory given  Plan Discussed with: CRNA  Anesthesia Plan Comments:         Anesthesia Quick Evaluation

## 2024-05-18 NOTE — Brief Op Note (Signed)
 05/18/2024  10:36 AM  PATIENT:  Jessica Villarreal  38 y.o. female  PRE-OPERATIVE DIAGNOSIS:  endometrial polyp  endocervical polyp. menorrhagia  POST-OPERATIVE DIAGNOSIS:  endometrial polyp endocervical polyp, menorrhagia  PROCEDURE:  diagnostic hysteroscopy, Dilation and curettage, hysteroscopic resection of endometrial and endocervical polyp   SURGEON:  Surgeons and Role:    * Rutherford Gain, MD - Primary  PHYSICIAN ASSISTANT:   ASSISTANTS: none   ANESTHESIA:   general Findings: multiple endometrial polyps, endocervical polyps EBL:  10 mL   BLOOD ADMINISTERED:none  DRAINS: none   LOCAL MEDICATIONS USED:  NONE  SPECIMEN:  Source of Specimen:  emc with   DISPOSITION OF SPECIMEN:  PATHOLOGY  COUNTS:  YES  TOURNIQUET:  * No tourniquets in log *  DICTATION: .Other Dictation: Dictation Number [id number]  PLAN OF CARE: Discharge to home after PACU  PATIENT DISPOSITION:  PACU - hemodynamically stable.   Delay start of Pharmacological VTE agent (>24hrs) due to surgical blood loss or risk of bleeding: no

## 2024-05-18 NOTE — Discharge Instructions (Addendum)
 CALL  IF TEMP>100.4, NOTHING PER VAGINA X 1 WK, CALL IF SOAKING A MAXI  PAD EVERY HOUR OR MORE FREQUENTLY   Post Anesthesia Home Care Instructions  Activity: Get plenty of rest for the remainder of the day. A responsible individual must stay with you for 24 hours following the procedure.  For the next 24 hours, DO NOT: -Drive a car -Advertising copywriter -Drink alcoholic beverages -Take any medication unless instructed by your physician -Make any legal decisions or sign important papers.  Meals: Start with liquid foods such as gelatin or soup. Progress to regular foods as tolerated. Avoid greasy, spicy, heavy foods. If nausea and/or vomiting occur, drink only clear liquids until the nausea and/or vomiting subsides. Call your physician if vomiting continues.  Special Instructions/Symptoms: Your throat may feel dry or sore from the anesthesia or the breathing tube placed in your throat during surgery. If this causes discomfort, gargle with warm salt water. The discomfort should disappear within 24 hours.   No ibuprofen , Advil , Aleve , Motrin , ketorolac , meloxicam , naproxen , or other NSAIDS until after 3:50 pm today if needed.

## 2024-05-18 NOTE — H&P (Signed)
 Jessica Villarreal is an 38 y.o. female. SF presents for surgery. Pt is scheduled for hysteroscopic resection of polyps, D&C, dx hysteroscopy  Pertinent Gynecological History: Menses: menorrhagia Bleeding: menorrhagia Contraception: none DES exposure: denies Blood transfusions: none Sexually transmitted diseases: no past history Previous GYN Procedures: DNC , myomectomy Last mammogram: n/a Date: n/a Last pap: 2024 Date: nl OB History: G1, P0010   Menstrual History: Menarche age: n/a Patient's last menstrual period was 04/14/2024 (exact date).    Past Medical History:  Diagnosis Date   Allergy    Anxiety    GERD (gastroesophageal reflux disease)    Hay fever    Ovarian cyst    Pre-diabetes    Yeast infection     Past Surgical History:  Procedure Laterality Date   MYOMECTOMY  2022   WISDOM TOOTH EXTRACTION      Family History  Problem Relation Age of Onset   Hypertension Mother    Healthy Father    Heart failure Maternal Grandmother    Diabetes Maternal Grandfather    Lymphoma Paternal Grandmother    Colon cancer Neg Hx    Stomach cancer Neg Hx    Rectal cancer Neg Hx    Esophageal cancer Neg Hx    Liver cancer Neg Hx     Social History:  reports that she has never smoked. She has never been exposed to tobacco smoke. She has never used smokeless tobacco. She reports current alcohol use of about 1.0 standard drink of alcohol per week. She reports that she does not use drugs.  Allergies:  Allergies  Allergen Reactions   Doxycycline  Other (See Comments)    Stomach aches.  Stomach aches.     No medications prior to admission.    Review of Systems  All other systems reviewed and are negative.   Weight 70.3 kg, last menstrual period 04/14/2024. Physical Exam Constitutional:      Appearance: Normal appearance.  Eyes:     Extraocular Movements: Extraocular movements intact.  Cardiovascular:     Rate and Rhythm: Regular rhythm.     Heart sounds: Normal  heart sounds.  Pulmonary:     Breath sounds: Normal breath sounds.  Abdominal:     Palpations: Abdomen is soft.  Genitourinary:    General: Normal vulva.     Comments: Vagina; no lesion Cervix nulliparous Uterus AV  sl enlarged Adnexa nl Musculoskeletal:     Cervical back: Neck supple.  Neurological:     General: No focal deficit present.     Mental Status: She is alert and oriented to person, place, and time.  Psychiatric:        Mood and Affect: Mood normal.        Behavior: Behavior normal.     No results found for this or any previous visit (from the past 24 hours).  No results found.  Assessment/Plan: Menorrhagia Endometrial polyp Endocervical polyp P) dx hysteroscopy, hysteroscopic resection of endometrial polyps using myosure, D&C. Risk of surgery reviewed including infection, bleeding, injury to surrounding organ structures, thermal injury, fluid overload and its mgmt, uterine perforation and its risk a. All ? answered Jessica Villarreal A Taneil Lazarus 05/18/2024, 6:22 AM

## 2024-05-18 NOTE — Interval H&P Note (Signed)
 History and Physical Interval Note:  05/18/2024 8:28 AM  Jessica Villarreal  has presented today for surgery, with the diagnosis of endometrial polyp  endocervical polyp.  The various methods of treatment have been discussed with the patient and family. After consideration of risks, benefits and other options for treatment, the patient has consented to  Procedure: diagnostic hysteroscopy, hysteroscopic resection of endometrial polyp using myosure, dilation and curettage  as a surgical intervention.  The patient's history has been reviewed, patient examined, no change in status, stable for surgery.  I have reviewed the patient's chart and labs.  Questions were answered to the patient's satisfaction.     Alok Minshall A Ruel Dimmick

## 2024-05-18 NOTE — Op Note (Signed)
 Jessica Villarreal, Jessica Villarreal MEDICAL RECORD NO: [id number] ACCOUNT NO: [id number] DATE OF BIRTH: 11-06-1985 FACILITY: [facility] LOCATION: [facility] PHYSICIAN: Hercules Hasler A. Rutherford, MD  Operative Report   DATE OF PROCEDURE: 05/18/2024  PREOPERATIVE DIAGNOSES: 1.  Menorrhagia. 2.  Endometrial polyp. 3.  Endocervical polyp.  POSTOPERATIVE DIAGNOSES: 1.  Menorrhagia. 2.  Endometrial polyp. 3.  Endocervical polyp.  PROCEDURES:  Diagnostic hysteroscopy, hysteroscopic resection of endometrial polyp and endocervical polyp using MyoSure, dilation and curettage.  SURGEON:  Dickie Rutherford, MD  ASSISTANT:  None.  ANESTHESIA:  General.  DESCRIPTION OF PROCEDURE:  Under adequate general anesthesia, the patient was placed in the dorsal lithotomy position.  She was sterilely prepped and draped in the usual fashion.  The patient was catheterized for a small amount of urine.  Examination  under anesthesia revealed an enlarged uterus with irregular posterior subserosal fibroid.  A bivalve speculum was placed in the vagina.  A single-tooth tenaculum was placed on the anterior lip of the cervix.  The cervix was serially dilated up to #17  [facility] dilator.  A diagnostic hysteroscope was introduced into the uterine cavity.  On entering the endocervical canal, multiple polypoid lesions consistent with polyps in the upper portion of the endocervical canal were noted.  Once the cavity was  traversed, both tubal ostias could be seen, but again additional anterior multiple endometrial polyps.  Using the Reach resectoscope, the endometrial polyps were first resected and the endometrial cavity was also resected and then the endocervical canal  was inspected and polypoid lesions were removed as well.  When all tissue was felt to be removed, all instruments were then removed from the vagina.  SPECIMENS LABELLED:  Endometrial curettings with endometrial polyp and endocervical polyp was sent to pathology.  ESTIMATED BLOOD  LOSS:  10 mL.  INTRAOPERATIVE FLUIDS:  900 mL.  FLUID DEFICIT:  350 mL.  COUNTS:  Sponge and instrument counts x 2 were correct.  COMPLICATIONS:  None.  CONDITION:  The patient tolerated the procedure well and was transferred to the recovery room in stable condition.    MUK D: 05/18/2024 10:48:34 am T: 05/18/2024 10:56:00 am  JOB: [id number]/ [id number]

## 2024-05-18 NOTE — Transfer of Care (Signed)
 Immediate Anesthesia Transfer of Care Note  Patient: Jessica Villarreal  Procedure(s) Performed: DILATATION & CURETTAGE/HYSTEROSCOPY WITH MYOSURE/ POLYP RESECTION (Vagina )  Patient Location: PACU  Anesthesia Type:Regional  Level of Consciousness: awake, alert , patient cooperative, and responds to stimulation  Airway & Oxygen Therapy: Patient Spontanous Breathing and Patient connected to face mask oxygen  Post-op Assessment: Report given to RN, Post -op Vital signs reviewed and stable, and Patient moving all extremities X 4  Post vital signs: Reviewed and stable  Last Vitals:  Vitals Value Taken Time  BP 111/66 05/18/24 10:33  Temp    Pulse 84 05/18/24 10:40  Resp 23 05/18/24 10:40  SpO2 94 % 05/18/24 10:40  Vitals shown include unfiled device data.  Last Pain:  Vitals:   05/18/24 0735  TempSrc: Oral  PainSc: 4          Complications: No notable events documented.

## 2024-05-18 NOTE — Anesthesia Postprocedure Evaluation (Signed)
 Anesthesia Post Note  Patient: Jessica Villarreal  Procedure(s) Performed: DILATATION & CURETTAGE/HYSTEROSCOPY WITH MYOSURE/ POLYP RESECTION (Vagina )     Patient location during evaluation: PACU Anesthesia Type: General Level of consciousness: awake and alert Pain management: pain level controlled Vital Signs Assessment: post-procedure vital signs reviewed and stable Respiratory status: spontaneous breathing, nonlabored ventilation and respiratory function stable Cardiovascular status: blood pressure returned to baseline and stable Postop Assessment: no apparent nausea or vomiting Anesthetic complications: no   No notable events documented.  Last Vitals:  Vitals:   05/18/24 1130 05/18/24 1200  BP: 107/71 104/73  Pulse: 66 64  Resp: 15 16  Temp:  36.6 C  SpO2: 94% 95%    Last Pain:  Vitals:   05/18/24 1200  TempSrc:   PainSc: 3                  Butler Levander Pinal

## 2024-05-18 NOTE — Anesthesia Procedure Notes (Signed)
 Procedure Name: LMA Insertion Date/Time: 05/18/2024 9:47 AM  Performed by: Jolynn Mage, CRNAPre-anesthesia Checklist: Patient identified, Emergency Drugs available, Suction available and Patient being monitored Patient Re-evaluated:Patient Re-evaluated prior to induction Oxygen Delivery Method: Circle system utilized Preoxygenation: Pre-oxygenation with 100% oxygen Induction Type: IV induction Ventilation: Mask ventilation without difficulty LMA: LMA flexible inserted LMA Size: 4.0 Number of attempts: 1 Placement Confirmation: positive ETCO2 and breath sounds checked- equal and bilateral Tube secured with: Tape Dental Injury: Teeth and Oropharynx as per pre-operative assessment

## 2024-05-19 ENCOUNTER — Encounter (HOSPITAL_COMMUNITY): Payer: Self-pay | Admitting: Obstetrics and Gynecology

## 2024-05-21 LAB — SURGICAL PATHOLOGY

## 2024-07-11 ENCOUNTER — Other Ambulatory Visit (HOSPITAL_BASED_OUTPATIENT_CLINIC_OR_DEPARTMENT_OTHER): Payer: Self-pay | Admitting: Family Medicine

## 2024-07-11 DIAGNOSIS — Z1231 Encounter for screening mammogram for malignant neoplasm of breast: Secondary | ICD-10-CM

## 2024-07-16 ENCOUNTER — Ambulatory Visit (HOSPITAL_BASED_OUTPATIENT_CLINIC_OR_DEPARTMENT_OTHER)

## 2024-07-16 ENCOUNTER — Inpatient Hospital Stay (HOSPITAL_BASED_OUTPATIENT_CLINIC_OR_DEPARTMENT_OTHER): Admission: RE | Admit: 2024-07-16 | Source: Ambulatory Visit

## 2024-07-19 ENCOUNTER — Ambulatory Visit (HOSPITAL_BASED_OUTPATIENT_CLINIC_OR_DEPARTMENT_OTHER)

## 2024-07-22 ENCOUNTER — Ambulatory Visit (HOSPITAL_BASED_OUTPATIENT_CLINIC_OR_DEPARTMENT_OTHER)

## 2024-07-24 ENCOUNTER — Inpatient Hospital Stay (HOSPITAL_BASED_OUTPATIENT_CLINIC_OR_DEPARTMENT_OTHER): Admission: RE | Admit: 2024-07-24 | Discharge: 2024-07-24 | Attending: Family Medicine

## 2024-07-24 ENCOUNTER — Encounter (HOSPITAL_BASED_OUTPATIENT_CLINIC_OR_DEPARTMENT_OTHER): Payer: Self-pay

## 2024-07-24 DIAGNOSIS — Z1231 Encounter for screening mammogram for malignant neoplasm of breast: Secondary | ICD-10-CM

## 2024-07-31 ENCOUNTER — Other Ambulatory Visit: Payer: Self-pay | Admitting: Family Medicine

## 2024-07-31 DIAGNOSIS — R928 Other abnormal and inconclusive findings on diagnostic imaging of breast: Secondary | ICD-10-CM

## 2024-08-15 ENCOUNTER — Ambulatory Visit
Admission: RE | Admit: 2024-08-15 | Discharge: 2024-08-15 | Disposition: A | Discharge status: Home / Self Care | Source: Ambulatory Visit | Attending: Family Medicine | Admitting: Family Medicine

## 2024-08-15 ENCOUNTER — Other Ambulatory Visit: Payer: Self-pay | Admitting: Family Medicine

## 2024-08-15 DIAGNOSIS — R928 Other abnormal and inconclusive findings on diagnostic imaging of breast: Secondary | ICD-10-CM

## 2025-02-13 ENCOUNTER — Encounter
